# Patient Record
Sex: Male | Born: 1975 | Race: Black or African American | Hispanic: No | Marital: Married | Smoking: Never smoker
Health system: Southern US, Community
[De-identification: ages and names within clinical notes are randomized; demographics above are authoritative.]

---

## 2000-09-29 ENCOUNTER — Emergency Department (HOSPITAL_COMMUNITY): Admission: EM | Admit: 2000-09-29 | Discharge: 2000-09-29 | Payer: Self-pay | Admitting: Emergency Medicine

## 2002-04-08 ENCOUNTER — Emergency Department (HOSPITAL_COMMUNITY): Admission: EM | Admit: 2002-04-08 | Discharge: 2002-04-08 | Payer: Self-pay | Admitting: Emergency Medicine

## 2002-04-08 ENCOUNTER — Encounter: Payer: Self-pay | Admitting: Emergency Medicine

## 2003-01-22 ENCOUNTER — Emergency Department (HOSPITAL_COMMUNITY): Admission: EM | Admit: 2003-01-22 | Discharge: 2003-01-23 | Payer: Self-pay | Admitting: Emergency Medicine

## 2006-04-26 ENCOUNTER — Emergency Department (HOSPITAL_COMMUNITY): Admission: EM | Admit: 2006-04-26 | Discharge: 2006-04-26 | Payer: Self-pay | Admitting: Emergency Medicine

## 2008-07-04 ENCOUNTER — Ambulatory Visit (HOSPITAL_COMMUNITY): Admission: RE | Admit: 2008-07-04 | Discharge: 2008-07-04 | Payer: Self-pay | Admitting: Family Medicine

## 2009-11-22 ENCOUNTER — Emergency Department (HOSPITAL_COMMUNITY): Admission: EM | Admit: 2009-11-22 | Discharge: 2009-11-22 | Payer: Self-pay | Admitting: Emergency Medicine

## 2011-10-20 ENCOUNTER — Emergency Department (INDEPENDENT_AMBULATORY_CARE_PROVIDER_SITE_OTHER)
Admission: EM | Admit: 2011-10-20 | Discharge: 2011-10-20 | Disposition: A | Discharge status: Home / Self Care | Payer: Self-pay | Source: Home / Self Care | Attending: Emergency Medicine | Admitting: Emergency Medicine

## 2011-10-20 ENCOUNTER — Encounter (HOSPITAL_COMMUNITY): Payer: Self-pay | Admitting: *Deleted

## 2011-10-20 DIAGNOSIS — S61209A Unspecified open wound of unspecified finger without damage to nail, initial encounter: Secondary | ICD-10-CM

## 2011-10-20 DIAGNOSIS — S61219A Laceration without foreign body of unspecified finger without damage to nail, initial encounter: Secondary | ICD-10-CM

## 2011-10-20 DIAGNOSIS — Z23 Encounter for immunization: Secondary | ICD-10-CM

## 2011-10-20 MED ORDER — TETANUS-DIPHTH-ACELL PERTUSSIS 5-2.5-18.5 LF-MCG/0.5 IM SUSP
INTRAMUSCULAR | Status: AC
  Filled 2011-10-20: qty 0.5

## 2011-10-20 MED ORDER — TETANUS-DIPHTH-ACELL PERTUSSIS 5-2.5-18.5 LF-MCG/0.5 IM SUSP
0.5000 mL | Freq: Once | INTRAMUSCULAR | Status: AC
  Administered 2011-10-20: 0.5 mL via INTRAMUSCULAR

## 2011-10-20 MED ORDER — IBUPROFEN 600 MG PO TABS: 600.0000 mg | ORAL_TABLET | Freq: Three times a day (TID) | ORAL | Status: AC | PRN

## 2011-10-20 NOTE — ED Notes (Signed)
Flap laceration right little finger occurred yesterday while doing dishes about 1600.  Last tetanus was over 5 years ago

## 2011-10-20 NOTE — ED Provider Notes (Signed)
History     CSN: [id number]  Arrival date & time 10/20/11  5784   First MD Initiated Contact with Patient 10/20/11 [phone]      Chief Complaint  Patient presents with  . Extremity Laceration    (Consider location/radiation/quality/duration/timing/severity/associated sxs/prior treatment) HPI Comments: Patient during work and doing dishes, sustained a cut to his right external aspects fifth finger. He is hurting and try to clean it yesterday. Woke up with some pain was wondering if it needed stitches. Patient denies any numbness tingling and affected finger or hand.  Patient is a 36 y.o. male presenting with skin laceration. The history is provided by the patient.  Laceration  The incident occurred yesterday. The laceration is 1 cm in size. The laceration mechanism was a broken glass. The pain is at a severity of 2/10. The pain is mild. The pain has been constant since onset. He reports no foreign bodies present. His tetanus status is out of date.    History reviewed. No pertinent past medical history.  History reviewed. No pertinent past surgical history.  History reviewed. No pertinent family history.  History  Substance Use Topics  . Smoking status: Former Smoker    Quit date: 10/19/2001  . Smokeless tobacco: Not on file  . Alcohol Use:      occasionally      Review of Systems  Constitutional: Negative for fever and chills.  Musculoskeletal: Negative for joint swelling.  Skin: Positive for wound.    Allergies  Review of patient's allergies indicates no known allergies.  Home Medications   Current Outpatient Rx  Name Route Sig Dispense Refill  . IBUPROFEN 600 MG PO TABS Oral Take 1 tablet (600 mg total) by mouth every 8 (eight) hours as needed for pain. 21 tablet 0    BP 106/56  Pulse 65  Temp(Src) 97.5 F (36.4 C) (Oral)  Resp 16  SpO2 99%  Physical Exam  Nursing note and vitals reviewed. Constitutional: He appears well-developed and well-nourished.    Eyes: Conjunctivae are normal.  Neck: Neck supple.  Musculoskeletal: He exhibits tenderness.       Hands: Neurological: He is alert. No cranial nerve deficit. He exhibits normal muscle tone.  Skin: Skin is warm.    ED Course  Procedures (including critical care time)  Labs Reviewed - No data to display No results found.   1. Laceration of finger       MDM  Patient presents urgent care more than 24 hours after he sustained a right fifth finger flap type laceration which he was doing dishes at his work. Updated tetanus status today. Wound care, uncomplicated laceration with no tendon or vascular involvement. Steri-Stripped. Patient was tested for for range of motion with no deficits or restrictions of movement.        Jimmie Molly, MD 10/20/11 [phone]

## 2011-10-20 NOTE — Discharge Instructions (Signed)
As discussed we have clean your room and close this flap laceration with Steri-Strips read discharge instructions. In the future if you sustained a laceration you should be seen as quickly as possible to reduce chance of infection. Return if any further concerns this point wound does not look infected, if any increased tenderness, redness or any discharge should return for reevaluation.

## 2012-01-25 ENCOUNTER — Emergency Department (HOSPITAL_COMMUNITY)
Admission: EM | Admit: 2012-01-25 | Discharge: 2012-01-25 | Disposition: A | Discharge status: Home / Self Care | Payer: Self-pay | Attending: Emergency Medicine | Admitting: Emergency Medicine

## 2012-01-25 ENCOUNTER — Encounter (HOSPITAL_COMMUNITY): Payer: Self-pay | Admitting: *Deleted

## 2012-01-25 DIAGNOSIS — F17200 Nicotine dependence, unspecified, uncomplicated: Secondary | ICD-10-CM | POA: Insufficient documentation

## 2012-01-25 DIAGNOSIS — K0889 Other specified disorders of teeth and supporting structures: Secondary | ICD-10-CM

## 2012-01-25 DIAGNOSIS — K089 Disorder of teeth and supporting structures, unspecified: Secondary | ICD-10-CM | POA: Insufficient documentation

## 2012-01-25 MED ORDER — HYDROCODONE-ACETAMINOPHEN 5-325 MG PO TABS: 1.0000 | ORAL_TABLET | Freq: Four times a day (QID) | ORAL | Status: AC | PRN

## 2012-01-25 MED ORDER — PENICILLIN V POTASSIUM 500 MG PO TABS: 500.0000 mg | ORAL_TABLET | Freq: Four times a day (QID) | ORAL | Status: AC

## 2012-01-25 NOTE — ED Notes (Signed)
Left upper dental pain since Friday with swelling

## 2012-01-25 NOTE — ED Notes (Signed)
Left upper tooth pain states knows he has an impacted wisdom tooth on that side hurting since last f4riday

## 2012-01-25 NOTE — ED Provider Notes (Signed)
History    This chart was scribed for James Lennert, MD by Sofie Rower. The patient was seen in room TR07C/TR07C and the patient's care was started at 12:03 PM    CSN: [id number]  Arrival date & time 01/25/12  4098   None     Chief Complaint  Patient presents with  . Dental Pain    (Consider location/radiation/quality/duration/timing/severity/associated sxs/prior treatment) Patient is a 36 y.o. male presenting with tooth pain. The history is provided by the patient. No language interpreter was used.  Dental PainThe primary symptoms include mouth pain. Primary symptoms do not include dental injury or fever. The symptoms began 3 to 5 days ago. The symptoms are unchanged. The symptoms occur constantly.  Mouth pain began 3 - 5 days ago. Mouth pain occurs constantly. Affected locations include: teeth.  Additional symptoms include: trouble swallowing and pain with swallowing.    History reviewed. No pertinent past medical history.  History reviewed. No pertinent past surgical history.  History reviewed. No pertinent family history.  History  Substance Use Topics  . Smoking status: Current Some Day Smoker    Last Attempt to Quit: 10/19/2001  . Smokeless tobacco: Not on file  . Alcohol Use: Yes     occasionally      Review of Systems  Constitutional: Negative for fever.  HENT: Positive for trouble swallowing.   All other systems reviewed and are negative.        Allergies  Review of patient's allergies indicates no known allergies.  Home Medications   Current Outpatient Rx  Name Route Sig Dispense Refill  . IBUPROFEN 200 MG PO TABS Oral Take 800 mg by mouth every 6 (six) hours as needed. For pain      BP 122/73  Pulse 64  Temp 98.4 F (36.9 C) (Oral)  Resp 16  SpO2 96%  Physical Exam  Nursing note and vitals reviewed. Constitutional: He is oriented to person, place, and time. He appears well-developed.  HENT:  Head: Normocephalic.       Left upper  molar tender, swollen.   Eyes: Conjunctivae are normal.  Neck: No tracheal deviation present.  Cardiovascular:  No murmur heard. Musculoskeletal: Normal range of motion.  Neurological: He is oriented to person, place, and time.  Skin: Skin is warm.  Psychiatric: He has a normal mood and affect.    ED Course  Procedures (including critical care time)  DIAGNOSTIC STUDIES: Oxygen Saturation is 96% on room air, adequate by my interpretation.    COORDINATION OF CARE:    10:05PM- EDP at bedside discusses treatment plan concerning pain management and application of antibiotics.    Labs Reviewed - No data to display No results found.   No diagnosis found.    MDM        The chart was scribed for me under my direct supervision.  I personally performed the history, physical, and medical decision making and all procedures in the evaluation of this patient.James Lennert, MD 01/27/12 [phone]

## 2013-08-30 ENCOUNTER — Encounter: Payer: Self-pay | Admitting: Physician Assistant

## 2013-08-30 ENCOUNTER — Ambulatory Visit (INDEPENDENT_AMBULATORY_CARE_PROVIDER_SITE_OTHER): Payer: BC Managed Care – PPO | Admitting: Physician Assistant

## 2013-08-30 VITALS — BP 120/70 | HR 60 | Temp 98.0°F | Resp 16 | Ht 69.5 in | Wt 191.0 lb

## 2013-08-30 DIAGNOSIS — J329 Chronic sinusitis, unspecified: Secondary | ICD-10-CM

## 2013-08-30 DIAGNOSIS — R0981 Nasal congestion: Secondary | ICD-10-CM

## 2013-08-30 DIAGNOSIS — J3489 Other specified disorders of nose and nasal sinuses: Secondary | ICD-10-CM

## 2013-08-30 MED ORDER — AMOXICILLIN-POT CLAVULANATE 875-125 MG PO TABS: 1.0000 | ORAL_TABLET | Freq: Two times a day (BID) | ORAL | Status: DC

## 2013-08-30 NOTE — Progress Notes (Signed)
   Subjective:    Patient ID: James Blair, male    DOB: 1976/04/29, 38 y.o.   MRN: [id number]  HPI 38 year old male presents for evaluation of 1 week history of nasal congestion, sinus pressure/pain, PND, and slight cough.  Symptoms have been progressively worsening despite treatment with OTC Mucinex daily.  He has also been using a humidifier which normally helps but has not this time.  No significant hx of frequent sinus infections or seasonal allergies.   Denies fever, chills, nausea, vomiting, otalgia, sore throat, SOB, wheezing, chest pain, or dizziness. Does have slight sinus headache due to pressure.  Patient is otherwise healthy with no other concerns today.    Congestion x 1 week Mucinex and humidifier  Subjective chills no fever  Review of Systems  Constitutional: Negative for fever and chills.  HENT: Positive for congestion, postnasal drip, rhinorrhea and sinus pressure. Negative for ear pain and sore throat.   Respiratory: Positive for cough. Negative for chest tightness, shortness of breath and wheezing.   Cardiovascular: Negative for chest pain.  Gastrointestinal: Negative for nausea, vomiting and abdominal pain.  Neurological: Positive for headaches. Negative for dizziness.       Objective:   Physical Exam  Constitutional: He is oriented to person, place, and time. He appears well-developed and well-nourished.  HENT:  Head: Normocephalic and atraumatic.  Right Ear: Hearing, tympanic membrane, external ear and ear canal normal.  Left Ear: Hearing, tympanic membrane, external ear and ear canal normal.  Mouth/Throat: Uvula is midline, oropharynx is clear and moist and mucous membranes are normal. No oropharyngeal exudate.  Eyes: Conjunctivae are normal.  Neck: Normal range of motion. Neck supple.  Cardiovascular: Normal rate, regular rhythm and normal heart sounds.   Pulmonary/Chest: Effort normal and breath sounds normal.  Lymphadenopathy:    He has no cervical  adenopathy.  Neurological: He is alert and oriented to person, place, and time.  Psychiatric: He has a normal mood and affect. His behavior is normal. Judgment and thought content normal.          Assessment & Plan:  Sinus infection - Plan: amoxicillin-clavulanate (AUGMENTIN) 875-125 MG per tablet  Will treat with Augmentin 875 mg bid x 10 days Continue Mucinex as directed Increase fluids and rest Follow up if symptoms worsen or fail to improve.

## 2013-11-23 ENCOUNTER — Ambulatory Visit (INDEPENDENT_AMBULATORY_CARE_PROVIDER_SITE_OTHER): Payer: BC Managed Care – PPO | Admitting: Family Medicine

## 2013-11-23 VITALS — BP 120/78 | HR 61 | Temp 98.3°F | Resp 20 | Ht 69.5 in | Wt 187.8 lb

## 2013-11-23 DIAGNOSIS — J329 Chronic sinusitis, unspecified: Secondary | ICD-10-CM

## 2013-11-23 DIAGNOSIS — J209 Acute bronchitis, unspecified: Secondary | ICD-10-CM

## 2013-11-23 MED ORDER — HYDROCODONE-HOMATROPINE 5-1.5 MG/5ML PO SYRP: 5.0000 mL | ORAL_SOLUTION | Freq: Three times a day (TID) | ORAL | Status: DC | PRN

## 2013-11-23 MED ORDER — AMOXICILLIN-POT CLAVULANATE 875-125 MG PO TABS: 1.0000 | ORAL_TABLET | Freq: Two times a day (BID) | ORAL | Status: DC

## 2013-11-23 NOTE — Progress Notes (Signed)
Patient ID: James Blair MRN: [id number], DOB: 07/30/1975, 38 y.o. Date of Encounter: 11/23/2013, 7:52 PM  Primary Physician: No PCP Per Patient  Chief Complaint:  Chief Complaint  Patient presents with  . URI    cough with yellow/green sputum that started today.  nasal/head congestion.      HPI: 38 y.o. year old male presents with a 2 day history of nasal congestion, post nasal drip, sore throat, and cough. Mild sinus pressure. Afebrile. No chills. Nasal congestion thick and green/yellow. Cough is productive of green/yellow sputum and not associated with time of day. Ears feel full, leading to sensation of muffled hearing. Has tried OTC cold preps without success. No GI complaints.   No sick contacts, recent antibiotics, or recent travels.   No leg trauma, sedentary periods, h/o cancer, or tobacco use.  No past medical history on file.   Home Meds: Prior to Admission medications   Medication Sig Start Date End Date Taking? Authorizing Provider  ibuprofen (ADVIL,MOTRIN) 200 MG tablet Take 800 mg by mouth every 6 (six) hours as needed. For pain   Yes Historical Provider, MD    Allergies: No Known Allergies  History   Social History  . Marital Status: Married    Spouse Name: N/A    Number of Children: N/A  . Years of Education: N/A   Occupational History  . Not on file.   Social History Main Topics  . Smoking status: Current Some Day Smoker    Last Attempt to Quit: 10/19/2001  . Smokeless tobacco: Not on file  . Alcohol Use: Yes     Comment: occasionally  . Drug Use: Yes     Comment: marijuana  . Sexual Activity: Not on file   Other Topics Concern  . Not on file   Social History Narrative  . No narrative on file     Review of Systems: Constitutional: negative for chills, fever, night sweats or weight changes Cardiovascular: negative for chest pain or palpitations Respiratory: negative for hemoptysis, wheezing, or shortness of breath Abdominal: negative  for abdominal pain, nausea, vomiting or diarrhea Dermatological: negative for rash Neurologic: negative for headache   Physical Exam: Blood pressure 120/78, pulse 61, temperature 98.3 F (36.8 C), temperature source Oral, resp. rate 20, height 5' 9.5" (1.765 m), weight 187 lb 12.8 oz (85.186 kg), SpO2 99.00%., Body mass index is 27.35 kg/(m^2). General: Well developed, well nourished, in no acute distress. Head: Normocephalic, atraumatic, eyes without discharge, sclera non-icteric, nares are congested. Bilateral auditory canals clear, TM's are without perforation, pearly grey with reflective cone of light bilaterally. No sinus TTP. Oral cavity moist, dentition normal. Posterior pharynx with post nasal drip and mild erythema. No peritonsillar abscess or tonsillar exudate. Neck: Supple. No thyromegaly. Full ROM. No lymphadenopathy. Lungs: Coarse breath sounds bilaterally without wheezes, rales, or rhonchi. Breathing is unlabored.  Heart: RRR with S1 S2. No murmurs, rubs, or gallops appreciated. Msk:  Strength and tone normal for age. Extremities: No clubbing or cyanosis. No edema. Neuro: Alert and oriented X 3. Moves all extremities spontaneously. CNII-XII grossly in tact. Psych:  Responds to questions appropriately with a normal affect.   Labs:   ASSESSMENT AND PLAN:  38 y.o. year old male with bronchitis. Sinus infection - Plan: amoxicillin-clavulanate (AUGMENTIN) 875-125 MG per tablet  Acute bronchitis - Plan: amoxicillin-clavulanate (AUGMENTIN) 875-125 MG per tablet, HYDROcodone-homatropine (HYCODAN) 5-1.5 MG/5ML syrup   - -Mucinex -Tylenol/Motrin prn -Rest/fluids -RTC precautions -RTC 3-5 days if no improvement  Signed,  Elvina SidleKurt Catharine Kettlewell, MD 11/23/2013 7:52 PM

## 2014-03-09 ENCOUNTER — Encounter (HOSPITAL_COMMUNITY): Payer: Self-pay | Admitting: Emergency Medicine

## 2014-03-09 ENCOUNTER — Emergency Department (HOSPITAL_COMMUNITY)
Admission: EM | Admit: 2014-03-09 | Discharge: 2014-03-09 | Disposition: A | Discharge status: Other Acute Inpatient Hospital | Payer: BC Managed Care – PPO | Source: Home / Self Care | Attending: Family Medicine | Admitting: Family Medicine

## 2014-03-09 ENCOUNTER — Emergency Department (HOSPITAL_COMMUNITY)
Admission: EM | Admit: 2014-03-09 | Discharge: 2014-03-09 | Disposition: A | Discharge status: Home / Self Care | Payer: BC Managed Care – PPO | Attending: Emergency Medicine | Admitting: Emergency Medicine

## 2014-03-09 DIAGNOSIS — Z79899 Other long term (current) drug therapy: Secondary | ICD-10-CM | POA: Insufficient documentation

## 2014-03-09 DIAGNOSIS — F17200 Nicotine dependence, unspecified, uncomplicated: Secondary | ICD-10-CM | POA: Diagnosis not present

## 2014-03-09 DIAGNOSIS — R1084 Generalized abdominal pain: Secondary | ICD-10-CM

## 2014-03-09 DIAGNOSIS — R197 Diarrhea, unspecified: Secondary | ICD-10-CM | POA: Diagnosis not present

## 2014-03-09 DIAGNOSIS — A084 Viral intestinal infection, unspecified: Secondary | ICD-10-CM

## 2014-03-09 DIAGNOSIS — A088 Other specified intestinal infections: Secondary | ICD-10-CM

## 2014-03-09 DIAGNOSIS — Z791 Long term (current) use of non-steroidal anti-inflammatories (NSAID): Secondary | ICD-10-CM | POA: Insufficient documentation

## 2014-03-09 DIAGNOSIS — R109 Unspecified abdominal pain: Secondary | ICD-10-CM | POA: Diagnosis present

## 2014-03-09 DIAGNOSIS — R112 Nausea with vomiting, unspecified: Secondary | ICD-10-CM | POA: Insufficient documentation

## 2014-03-09 LAB — BASIC METABOLIC PANEL
ANION GAP: 13 (ref 5–15)
BUN: 8 mg/dL (ref 6–23)
CALCIUM: 8.9 mg/dL (ref 8.4–10.5)
CO2: 28 mEq/L (ref 19–32)
Chloride: 95 mEq/L — ABNORMAL LOW (ref 96–112)
Creatinine, Ser: 1.05 mg/dL (ref 0.50–1.35)
GFR calc Af Amer: >90 mL/min (ref >90)
GFR, EST NON AFRICAN AMERICAN: 88 mL/min — AB (ref >90)
Glucose, Bld: 117 mg/dL — ABNORMAL HIGH (ref 70–99)
Potassium: 3.6 mEq/L — ABNORMAL LOW (ref 3.7–5.3)
SODIUM: 136 meq/L — AB (ref 137–147)

## 2014-03-09 LAB — POCT I-STAT, CHEM 8
BUN: 6 mg/dL (ref 6–23)
CALCIUM ION: 1.11 mmol/L — AB (ref 1.12–1.23)
Chloride: 97 mEq/L (ref 96–112)
Creatinine, Ser: 1.1 mg/dL (ref 0.50–1.35)
GLUCOSE: 134 mg/dL — AB (ref 70–99)
HEMATOCRIT: 46 % (ref 39.0–52.0)
HEMOGLOBIN: 15.6 g/dL (ref 13.0–17.0)
POTASSIUM: 3.3 meq/L — AB (ref 3.7–5.3)
Sodium: 131 mEq/L — ABNORMAL LOW (ref 137–147)
TCO2: 29 mmol/L (ref 0–100)

## 2014-03-09 LAB — CBC WITH DIFFERENTIAL/PLATELET
BASOS ABS: 0 10*3/uL (ref 0.0–0.1)
Basophils Relative: 0 % (ref 0–1)
EOS ABS: 0 10*3/uL (ref 0.0–0.7)
Eosinophils Relative: 0 % (ref 0–5)
HCT: 38.5 % — ABNORMAL LOW (ref 39.0–52.0)
Hemoglobin: 13.3 g/dL (ref 13.0–17.0)
LYMPHS PCT: 19 % (ref 12–46)
Lymphs Abs: 1.2 10*3/uL (ref 0.7–4.0)
MCH: 28.5 pg (ref 26.0–34.0)
MCHC: 34.5 g/dL (ref 30.0–36.0)
MCV: 82.6 fL (ref 78.0–100.0)
Monocytes Absolute: 1.6 10*3/uL — ABNORMAL HIGH (ref 0.1–1.0)
Monocytes Relative: 25 % — ABNORMAL HIGH (ref 3–12)
NEUTROS PCT: 56 % (ref 43–77)
Neutro Abs: 3.6 10*3/uL (ref 1.7–7.7)
Platelets: 196 10*3/uL (ref 150–400)
RBC: 4.66 MIL/uL (ref 4.22–5.81)
RDW: 12.2 % (ref 11.5–15.5)
WBC: 6.4 10*3/uL (ref 4.0–10.5)

## 2014-03-09 LAB — URINALYSIS, ROUTINE W REFLEX MICROSCOPIC
BILIRUBIN URINE: NEGATIVE
Glucose, UA: NEGATIVE mg/dL
Ketones, ur: 15 mg/dL — AB
Leukocytes, UA: NEGATIVE
NITRITE: NEGATIVE
Protein, ur: 30 mg/dL — AB
SPECIFIC GRAVITY, URINE: 1.013 (ref 1.005–1.030)
UROBILINOGEN UA: 0.2 mg/dL (ref 0.0–1.0)
pH: 6 (ref 5.0–8.0)

## 2014-03-09 LAB — HEPATIC FUNCTION PANEL
ALK PHOS: 56 U/L (ref 39–117)
ALT: 20 U/L (ref 0–53)
AST: 18 U/L (ref 0–37)
Albumin: 3.2 g/dL — ABNORMAL LOW (ref 3.5–5.2)
BILIRUBIN INDIRECT: 0.9 mg/dL (ref 0.3–0.9)
Bilirubin, Direct: 0.2 mg/dL (ref 0.0–0.3)
TOTAL PROTEIN: 6.6 g/dL (ref 6.0–8.3)
Total Bilirubin: 1.1 mg/dL (ref 0.3–1.2)

## 2014-03-09 LAB — LIPASE, BLOOD: LIPASE: 12 U/L (ref 11–59)

## 2014-03-09 LAB — URINE MICROSCOPIC-ADD ON: RBC / HPF: NONE SEEN RBC/hpf (ref <3)

## 2014-03-09 MED ORDER — SODIUM CHLORIDE 0.9 % IV BOLUS (SEPSIS)
1000.0000 mL | Freq: Once | INTRAVENOUS | Status: AC
  Administered 2014-03-09: 1000 mL via INTRAVENOUS

## 2014-03-09 MED ORDER — DICYCLOMINE HCL 20 MG PO TABS: 20.0000 mg | ORAL_TABLET | Freq: Two times a day (BID) | ORAL | Status: DC

## 2014-03-09 MED ORDER — HYDROCODONE-ACETAMINOPHEN 5-325 MG PO TABS: ORAL_TABLET | ORAL | Status: DC

## 2014-03-09 MED ORDER — DICYCLOMINE HCL 10 MG PO CAPS
10.0000 mg | ORAL_CAPSULE | Freq: Once | ORAL | Status: AC
  Administered 2014-03-09: 10 mg via ORAL
  Filled 2014-03-09: qty 1

## 2014-03-09 MED ORDER — ONDANSETRON HCL 4 MG/2ML IJ SOLN
4.0000 mg | Freq: Once | INTRAMUSCULAR | Status: AC
  Administered 2014-03-09: 4 mg via INTRAVENOUS
  Filled 2014-03-09: qty 2

## 2014-03-09 MED ORDER — MORPHINE SULFATE 4 MG/ML IJ SOLN
4.0000 mg | INTRAMUSCULAR | Status: DC | PRN
  Filled 2014-03-09: qty 1

## 2014-03-09 MED ORDER — PROMETHAZINE HCL 25 MG PO TABS: 25.0000 mg | ORAL_TABLET | Freq: Four times a day (QID) | ORAL | Status: DC | PRN

## 2014-03-09 NOTE — ED Provider Notes (Signed)
Medical screening examination/treatment/procedure(s) were performed by non-physician practitioner and as supervising physician I was immediately available for consultation/collaboration.   EKG Interpretation None      Devoria Albe, MD, Armando Gang   Ward Givens, MD 03/09/14 1251

## 2014-03-09 NOTE — ED Notes (Signed)
Nausea and vomiting since Thurs lasted one day; no anorexic with diarr. Pt sent from UC due to abnormal lab work.

## 2014-03-09 NOTE — Discharge Instructions (Signed)
Take vicodin for breakthrough pain, do not drink alcohol, drive, care for children or do other critical tasks while taking vicodin.  Push fluids: take small frequent sips of water or Gatorade, do not drink any soda, juice or caffeinated beverages.    Slowly resume solid diet as desired. Avoid food that are spicy, contain dairy and/or have high fat content.  Aviod NSAIDs (aspirin, motrin, ibuprofen, naproxen, Aleve et Karie Soda) for pain control because they will irritate your stomach.  Please establish primary care using resource guide below. You may also want to call [facility] or [facility] offices. They must obtain records for further management.   Do not hesitate to return to the Emergency Department for any new, worsening or concerning symptoms.

## 2014-03-09 NOTE — ED Provider Notes (Signed)
CSN: [id number]     Arrival date & time 03/09/14  0930 History   First MD Initiated Contact with Patient 03/09/14 [phone]     Chief Complaint  Patient presents with  . Diarrhea  . Abdominal Pain     (Consider location/radiation/quality/duration/timing/severity/associated sxs/prior Treatment) HPI  James Blair is a 38 y.o. male who is otherwise healthy complaining of low back pain with objective fever and chills onset 4 days ago followed by nonbloody, nonbilious, no coffee ground emesis and watery diarrhea approximately 10 times per day with midline lower abdominal pain and rectal discomfort, states stool is becoming more formed; he has noticed blood streaking in the stool today. Pain is 7/10 and exacerbated with bowel movements. He denies active fever and chills, recent antibiotic use, sick contacts, chest pain, shortness of breath. States that he was seen at urgent care several days ago and given Zofran and loperamide and started a Bratt diet: the vomiting has resolved but the diarrhea is still active. Denies any pain at the moment. Denies dysuria, hematuria, urethral discharge, testicular pain swelling, rash, concern about STDs.  History reviewed. No pertinent past medical history. History reviewed. No pertinent past surgical history. History reviewed. No pertinent family history. History  Substance Use Topics  . Smoking status: Current Some Day Smoker    Last Attempt to Quit: 10/19/2001  . Smokeless tobacco: Not on file  . Alcohol Use: Yes     Comment: occasionally    Review of Systems  10 systems reviewed and found to be negative, except as noted in the HPI.    Allergies  Review of patient's allergies indicates no known allergies.  Home Medications   Prior to Admission medications   Medication Sig Start Date End Date Taking? Authorizing Provider  diphenoxylate-atropine (LOMOTIL) 2.5-0.025 MG per tablet Take 1 tablet by mouth 4 (four) times daily as needed for diarrhea or  loose stools.   Yes Historical Provider, MD  ibuprofen (ADVIL,MOTRIN) 200 MG tablet Take 800 mg by mouth every 6 (six) hours as needed for fever, headache or mild pain. For pain   Yes Historical Provider, MD  meloxicam (MOBIC) 15 MG tablet Take 15 mg by mouth daily. 02/26/14  Yes Historical Provider, MD  ondansetron (ZOFRAN-ODT) 8 MG disintegrating tablet Take 8 mg by mouth every 8 (eight) hours as needed for nausea or vomiting.   Yes Historical Provider, MD  dicyclomine (BENTYL) 20 MG tablet Take 1 tablet (20 mg total) by mouth 2 (two) times daily. 03/09/14   Jennessa Trigo, PA-C  HYDROcodone-acetaminophen (NORCO/VICODIN) 5-325 MG per tablet Take 1-2 tablets by mouth every 6 hours as needed for pain. 03/09/14   Tauheed Mcfayden, PA-C  promethazine (PHENERGAN) 25 MG tablet Take 1 tablet (25 mg total) by mouth every 6 (six) hours as needed for nausea or vomiting. 03/09/14   Joni Reining Kennette Cuthrell, PA-C   BP 142/79  Pulse 57  Temp(Src) 99 F (37.2 C) (Oral)  Resp 12  Ht  (1.753 m)  Wt 189 lb (85.73 kg)  BMI 27.90 kg/m2  SpO2 97% Physical Exam  Nursing note and vitals reviewed. Constitutional: He is oriented to person, place, and time. He appears well-developed and well-nourished. No distress.  HENT:  Head: Normocephalic and atraumatic.  Mouth/Throat: Oropharynx is clear and moist.  Eyes: Conjunctivae and EOM are normal.  Neck: Normal range of motion.  Cardiovascular: Normal rate, regular rhythm and intact distal pulses.   Pulmonary/Chest: Effort normal and breath sounds normal. No stridor.  Abdominal: Soft. Bowel  sounds are normal. He exhibits no distension and no mass. There is no tenderness. There is no rebound and no guarding.  Very mild diffuse bilateral lower quadrant tenderness to palpation with no guarding or rebound.  Genitourinary:  No CVA tenderness to palpation bilaterally  Musculoskeletal: Normal range of motion.  Neurological: He is alert and oriented to person, place, and  time.  Psychiatric: He has a normal mood and affect.    ED Course  Procedures (including critical care time) Labs Review Labs Reviewed  CBC WITH DIFFERENTIAL - Abnormal; Notable for the following:    HCT 38.5 (*)    Monocytes Relative 25 (*)    Monocytes Absolute 1.6 (*)    All other components within normal limits  BASIC METABOLIC PANEL - Abnormal; Notable for the following:    Sodium 136 (*)    Potassium 3.6 (*)    Chloride 95 (*)    Glucose, Bld 117 (*)    GFR calc non Af Amer 88 (*)    All other components within normal limits  HEPATIC FUNCTION PANEL - Abnormal; Notable for the following:    Albumin 3.2 (*)    All other components within normal limits  URINALYSIS, ROUTINE W REFLEX MICROSCOPIC - Abnormal; Notable for the following:    Hgb urine dipstick TRACE (*)    Ketones, ur 15 (*)    Protein, ur 30 (*)    All other components within normal limits  URINE MICROSCOPIC-ADD ON - Abnormal; Notable for the following:    Bacteria, UA FEW (*)    All other components within normal limits  LIPASE, BLOOD    Imaging Review No results found.   EKG Interpretation None      MDM   Final diagnoses:  Nausea vomiting and diarrhea  Generalized abdominal pain   Filed Vitals:   03/09/14 0948 03/09/14 1030 03/09/14 1100 03/09/14 1143  BP: 135/82 146/73 144/71 142/79  Pulse: 61 55 107 57  Temp: 98.4 F (36.9 C)   99 F (37.2 C)  TempSrc: Oral   Oral  Resp: Height:  (1.753 m)     Weight: 189 lb (85.73 kg)     SpO2: 100% 97% 100% 97%    Medications  morphine 4 MG/ML injection 4 mg (not administered)  sodium chloride 0.9 % bolus 1,000 mL (1,000 mLs Intravenous New Bag/Given 03/09/14 1046)  ondansetron (ZOFRAN) injection 4 mg (4 mg Intravenous Given 03/09/14 1035)  dicyclomine (BENTYL) capsule 10 mg (10 mg Oral Given 03/09/14 1035)  sodium chloride 0.9 % bolus 1,000 mL (0 mLs Intravenous Stopped 03/09/14 1046)    James Blair is a 38 y.o. male presenting  with Patient is nontoxic, nonseptic appearing, in no apparent distress.  Patient's pain and other symptoms adequately managed in emergency department.  Fluid bolus given.  Labs, imaging and vitals reviewed.  Patient does not meet the SIRS or Sepsis criteria.  On repeat exam patient does not have a surgical abdomin and there are no peritoneal signs.  No indication of appendicitis, bowel obstruction, bowel perforation, cholecystitis, diverticulitis. Patient discharged home with symptomatic treatment and given strict instructions for follow-up with their primary care physician.  I have also discussed reasons to return immediately to the ER.  Patient expresses understanding and agrees with plan.  Evaluation does not show pathology that would require ongoing emergent intervention or inpatient treatment. Pt is hemodynamically stable and mentating appropriately. Discussed findings and plan with patient/guardian, who agrees with care  plan. All questions answered. Return precautions discussed and outpatient follow up given.   New Prescriptions   DICYCLOMINE (BENTYL) 20 MG TABLET    Take 1 tablet (20 mg total) by mouth 2 (two) times daily.   HYDROCODONE-ACETAMINOPHEN (NORCO/VICODIN) 5-325 MG PER TABLET    Take 1-2 tablets by mouth every 6 hours as needed for pain.   PROMETHAZINE (PHENERGAN) 25 MG TABLET    Take 1 tablet (25 mg total) by mouth every 6 (six) hours as needed for nausea or vomiting.        Wynetta Emery, PA-C 03/09/14 1248  Nicandro Perrault, PA-C 03/09/14 1251

## 2014-03-09 NOTE — ED Notes (Signed)
Pt provided ginger ale. Tolerating well.

## 2014-03-09 NOTE — ED Notes (Signed)
C/o 1 week duration of abdominal pain, nausea, vomiting. See last week for stomach problems at another urgent care, but continues to  Have problem w lower abdominal pain, diarrhea and vomiting. No one else in home ill

## 2014-03-09 NOTE — ED Provider Notes (Signed)
CSN: [id number]     Arrival date & time 03/09/14  0930 History   First MD Initiated Contact with Patient 03/09/14 [phone]     Chief Complaint  Patient presents with  . Diarrhea  . Abdominal Pain   (Consider location/radiation/quality/duration/timing/severity/associated sxs/prior Treatment) HPI  History reviewed. No pertinent past medical history. History reviewed. No pertinent past surgical history. History reviewed. No pertinent family history. History  Substance Use Topics  . Smoking status: Current Some Day Smoker    Last Attempt to Quit: 10/19/2001  . Smokeless tobacco: Not on file  . Alcohol Use: Yes     Comment: occasionally    Review of Systems  Allergies  Review of patient's allergies indicates no known allergies.  Home Medications   Prior to Admission medications   Medication Sig Start Date End Date Taking? Authorizing Provider  diphenoxylate-atropine (LOMOTIL) 2.5-0.025 MG per tablet Take 1 tablet by mouth 4 (four) times daily as needed for diarrhea or loose stools.   Yes Historical Provider, MD  ibuprofen (ADVIL,MOTRIN) 200 MG tablet Take 800 mg by mouth every 6 (six) hours as needed for fever, headache or mild pain. For pain   Yes Historical Provider, MD  meloxicam (MOBIC) 15 MG tablet Take 15 mg by mouth daily. 02/26/14  Yes Historical Provider, MD  ondansetron (ZOFRAN-ODT) 8 MG disintegrating tablet Take 8 mg by mouth every 8 (eight) hours as needed for nausea or vomiting.   Yes Historical Provider, MD  dicyclomine (BENTYL) 20 MG tablet Take 1 tablet (20 mg total) by mouth 2 (two) times daily. 03/09/14   Nicole Pisciotta, PA-C  HYDROcodone-acetaminophen (NORCO/VICODIN) 5-325 MG per tablet Take 1-2 tablets by mouth every 6 hours as needed for pain. 03/09/14   Nicole Pisciotta, PA-C  promethazine (PHENERGAN) 25 MG tablet Take 1 tablet (25 mg total) by mouth every 6 (six) hours as needed for nausea or vomiting. 03/09/14   Joni Reining Pisciotta, PA-C   BP 136/69  Pulse 57   Temp(Src) 99 F (37.2 C) (Oral)  Resp 26  Ht  (1.753 m)  Wt 189 lb (85.73 kg)  BMI 27.90 kg/m2  SpO2 98% Physical Exam  ED Course  Procedures (including critical care time) Labs Review Labs Reviewed  CBC WITH DIFFERENTIAL - Abnormal; Notable for the following:    HCT 38.5 (*)    Monocytes Relative 25 (*)    Monocytes Absolute 1.6 (*)    All other components within normal limits  BASIC METABOLIC PANEL - Abnormal; Notable for the following:    Sodium 136 (*)    Potassium 3.6 (*)    Chloride 95 (*)    Glucose, Bld 117 (*)    GFR calc non Af Amer 88 (*)    All other components within normal limits  HEPATIC FUNCTION PANEL - Abnormal; Notable for the following:    Albumin 3.2 (*)    All other components within normal limits  URINALYSIS, ROUTINE W REFLEX MICROSCOPIC - Abnormal; Notable for the following:    Hgb urine dipstick TRACE (*)    Ketones, ur 15 (*)    Protein, ur 30 (*)    All other components within normal limits  URINE MICROSCOPIC-ADD ON - Abnormal; Notable for the following:    Bacteria, UA FEW (*)    All other components within normal limits  LIPASE, BLOOD    Imaging Review No results found.   MDM   1. Nausea vomiting and diarrhea   2. Generalized abdominal pain  Linna Hoff, MD 03/09/14 1332

## 2014-03-09 NOTE — ED Provider Notes (Signed)
CSN: [id number]     Arrival date & time 03/09/14  4098 History   First MD Initiated Contact with Patient 03/09/14 0831     Chief Complaint  Patient presents with  . Abdominal Pain   (Consider location/radiation/quality/duration/timing/severity/associated sxs/prior Treatment) Patient is a 38 y.o. male presenting with abdominal pain. The history is provided by the patient and the spouse.  Abdominal Pain Pain location:  LLQ and RLQ Pain quality: cramping   Pain radiates to:  Does not radiate Pain severity:  Mild Onset quality:  Gradual Duration:  4 days Progression:  Worsening Chronicity:  New Context: not suspicious food intake   Context comment:  Seen at [facility] and given meds, sx continue.blood now with bm. Associated symptoms: anorexia, diarrhea, nausea and vomiting   Associated symptoms: no fever and no melena     History reviewed. No pertinent past medical history. History reviewed. No pertinent past surgical history. History reviewed. No pertinent family history. History  Substance Use Topics  . Smoking status: Current Some Day Smoker    Last Attempt to Quit: 10/19/2001  . Smokeless tobacco: Not on file  . Alcohol Use: Yes     Comment: occasionally    Review of Systems  Constitutional: Positive for appetite change. Negative for fever and activity change.  HENT: Negative.   Gastrointestinal: Positive for nausea, vomiting, abdominal pain, diarrhea, blood in stool and anorexia. Negative for melena.  Genitourinary: Positive for difficulty urinating.  Musculoskeletal: Negative.   Skin: Negative.     Allergies  Review of patient's allergies indicates no known allergies.  Home Medications   Prior to Admission medications   Medication Sig Start Date End Date Taking? Authorizing Provider  diphenoxylate-atropine (LOMOTIL) 2.5-0.025 MG per tablet Take 1 tablet by mouth 4 (four) times daily as needed for diarrhea or loose stools.   Yes Historical Provider, MD   ondansetron (ZOFRAN-ODT) 8 MG disintegrating tablet Take 8 mg by mouth every 8 (eight) hours as needed for nausea or vomiting.   Yes Historical Provider, MD  amoxicillin-clavulanate (AUGMENTIN) 875-125 MG per tablet Take 1 tablet by mouth 2 (two) times daily. 11/23/13   Elvina Sidle, MD  HYDROcodone-homatropine [facility]) 5-1.5 MG/5ML syrup Take 5 mLs by mouth every 8 (eight) hours as needed for cough. 11/23/13   Elvina Sidle, MD  ibuprofen (ADVIL,MOTRIN) 200 MG tablet Take 800 mg by mouth every 6 (six) hours as needed. For pain    Historical Provider, MD   Temp(Src) 99.3 F (37.4 C) (Oral)  Resp 12  SpO2 100% Physical Exam  Nursing note and vitals reviewed. Constitutional: He appears well-developed and well-nourished.  HENT:  Mouth/Throat: Oropharynx is clear and moist.  Pulmonary/Chest: Effort normal and breath sounds normal.  Abdominal: Soft. Bowel sounds are normal. He exhibits no distension and no mass. There is no hepatosplenomegaly. There is tenderness in the suprapubic area. There is no rigidity, no rebound, no guarding, no CVA tenderness, no tenderness at McBurney's point and negative Murphy's sign.  Skin: Skin is warm and dry.    ED Course  Procedures (including critical care time) Labs Review Labs Reviewed  POCT I-STAT, CHEM 8 - Abnormal; Notable for the following:    Sodium 131 (*)    Potassium 3.3 (*)    Glucose, Bld 134 (*)    Calcium, Ion 1.11 (*)    All other components within normal limits    Imaging Review No results found.   MDM   1. Gastroenteritis and colitis, viral  Sent for protracted n/v/d in spite of meds and bland diet changes.    Linna Hoff, MD 03/09/14 [phone]

## 2014-03-11 ENCOUNTER — Emergency Department (HOSPITAL_COMMUNITY): Payer: BC Managed Care – PPO

## 2014-03-11 ENCOUNTER — Encounter (HOSPITAL_COMMUNITY): Payer: Self-pay | Admitting: Emergency Medicine

## 2014-03-11 ENCOUNTER — Emergency Department (HOSPITAL_COMMUNITY)
Admission: EM | Admit: 2014-03-11 | Discharge: 2014-03-11 | Disposition: A | Discharge status: Home / Self Care | Payer: BC Managed Care – PPO | Attending: Emergency Medicine | Admitting: Emergency Medicine

## 2014-03-11 DIAGNOSIS — E876 Hypokalemia: Secondary | ICD-10-CM | POA: Diagnosis not present

## 2014-03-11 DIAGNOSIS — R109 Unspecified abdominal pain: Secondary | ICD-10-CM | POA: Insufficient documentation

## 2014-03-11 DIAGNOSIS — R197 Diarrhea, unspecified: Secondary | ICD-10-CM | POA: Diagnosis not present

## 2014-03-11 DIAGNOSIS — R748 Abnormal levels of other serum enzymes: Secondary | ICD-10-CM

## 2014-03-11 DIAGNOSIS — F17200 Nicotine dependence, unspecified, uncomplicated: Secondary | ICD-10-CM | POA: Diagnosis not present

## 2014-03-11 DIAGNOSIS — Z791 Long term (current) use of non-steroidal anti-inflammatories (NSAID): Secondary | ICD-10-CM | POA: Insufficient documentation

## 2014-03-11 DIAGNOSIS — Z79899 Other long term (current) drug therapy: Secondary | ICD-10-CM | POA: Insufficient documentation

## 2014-03-11 LAB — COMPREHENSIVE METABOLIC PANEL
ALT: 17 U/L (ref 0–53)
ANION GAP: 14 (ref 5–15)
AST: 24 U/L (ref 0–37)
Albumin: 3.3 g/dL — ABNORMAL LOW (ref 3.5–5.2)
Alkaline Phosphatase: 59 U/L (ref 39–117)
BUN: 6 mg/dL (ref 6–23)
CALCIUM: 9.3 mg/dL (ref 8.4–10.5)
CO2: 27 mEq/L (ref 19–32)
CREATININE: 0.91 mg/dL (ref 0.50–1.35)
Chloride: 96 mEq/L (ref 96–112)
GFR calc non Af Amer: >90 mL/min (ref >90)
GLUCOSE: 104 mg/dL — AB (ref 70–99)
Potassium: 3.1 mEq/L — ABNORMAL LOW (ref 3.7–5.3)
Sodium: 137 mEq/L (ref 137–147)
TOTAL PROTEIN: 6.7 g/dL (ref 6.0–8.3)
Total Bilirubin: 0.9 mg/dL (ref 0.3–1.2)

## 2014-03-11 LAB — CBC WITH DIFFERENTIAL/PLATELET
Basophils Absolute: 0 10*3/uL (ref 0.0–0.1)
Basophils Relative: 1 % (ref 0–1)
EOS ABS: 0 10*3/uL (ref 0.0–0.7)
EOS PCT: 0 % (ref 0–5)
HEMATOCRIT: 37.7 % — AB (ref 39.0–52.0)
Hemoglobin: 13.1 g/dL (ref 13.0–17.0)
LYMPHS ABS: 1.8 10*3/uL (ref 0.7–4.0)
Lymphocytes Relative: 27 % (ref 12–46)
MCH: 28.5 pg (ref 26.0–34.0)
MCHC: 34.7 g/dL (ref 30.0–36.0)
MCV: 82.1 fL (ref 78.0–100.0)
MONO ABS: 2.2 10*3/uL — AB (ref 0.1–1.0)
MONOS PCT: 33 % — AB (ref 3–12)
Neutro Abs: 2.6 10*3/uL (ref 1.7–7.7)
Neutrophils Relative %: 39 % — ABNORMAL LOW (ref 43–77)
PLATELETS: 245 10*3/uL (ref 150–400)
RBC: 4.59 MIL/uL (ref 4.22–5.81)
RDW: 12.3 % (ref 11.5–15.5)
WBC: 6.6 10*3/uL (ref 4.0–10.5)

## 2014-03-11 LAB — LIPASE, BLOOD: Lipase: 193 U/L — ABNORMAL HIGH (ref 11–59)

## 2014-03-11 LAB — CLOSTRIDIUM DIFFICILE BY PCR: Toxigenic C. Difficile by PCR: NEGATIVE

## 2014-03-11 MED ORDER — METRONIDAZOLE 500 MG PO TABS
500.0000 mg | ORAL_TABLET | Freq: Once | ORAL | Status: AC
  Administered 2014-03-11: 500 mg via ORAL
  Filled 2014-03-11: qty 1

## 2014-03-11 MED ORDER — METRONIDAZOLE 500 MG PO TABS: 500.0000 mg | ORAL_TABLET | Freq: Three times a day (TID) | ORAL | Status: DC

## 2014-03-11 MED ORDER — SODIUM CHLORIDE 0.9 % IV BOLUS (SEPSIS)
1000.0000 mL | Freq: Once | INTRAVENOUS | Status: AC
  Administered 2014-03-11: 1000 mL via INTRAVENOUS

## 2014-03-11 MED ORDER — CIPROFLOXACIN HCL 500 MG PO TABS: 500.0000 mg | ORAL_TABLET | Freq: Two times a day (BID) | ORAL | Status: DC

## 2014-03-11 MED ORDER — POTASSIUM CHLORIDE CRYS ER 20 MEQ PO TBCR
40.0000 meq | EXTENDED_RELEASE_TABLET | Freq: Once | ORAL | Status: AC
  Administered 2014-03-11: 40 meq via ORAL
  Filled 2014-03-11: qty 2

## 2014-03-11 MED ORDER — CIPROFLOXACIN HCL 500 MG PO TABS
500.0000 mg | ORAL_TABLET | Freq: Once | ORAL | Status: AC
  Administered 2014-03-11: 500 mg via ORAL
  Filled 2014-03-11: qty 1

## 2014-03-11 NOTE — ED Provider Notes (Signed)
Medical screening examination/treatment/procedure(s) were performed by non-physician practitioner and as supervising physician I was immediately available for consultation/collaboration.   EKG Interpretation None       Barre Aydelott L Janine Reller, MD 03/11/14 2143 

## 2014-03-11 NOTE — Discharge Instructions (Signed)
Continue bentyl for spasms, lomotil only if needed for diarrhea. Take both cipro and flagyl for possible infection until all gone. Your stool cultures are pending, you will be notified if return abnormal. Follow up with gastroenterologist.   Diarrhea Diarrhea is frequent loose and watery bowel movements. It can cause you to feel weak and dehydrated. Dehydration can cause you to become tired and thirsty, have a dry mouth, and have decreased urination that often is dark yellow. Diarrhea is a sign of another problem, most often an infection that will not last long. In most cases, diarrhea typically lasts 2-3 days. However, it can last longer if it is a sign of something more serious. It is important to treat your diarrhea as directed by your caregiver to lessen or prevent future episodes of diarrhea. CAUSES  Some common causes include:  Gastrointestinal infections caused by viruses, bacteria, or parasites.  Food poisoning or food allergies.  Certain medicines, such as antibiotics, chemotherapy, and laxatives.  Artificial sweeteners and fructose.  Digestive disorders. HOME CARE INSTRUCTIONS  Ensure adequate fluid intake (hydration): Have 1 cup (8 oz) of fluid for each diarrhea episode. Avoid fluids that contain simple sugars or sports drinks, fruit juices, whole milk products, and sodas. Your urine should be clear or pale yellow if you are drinking enough fluids. Hydrate with an oral rehydration solution that you can purchase at pharmacies, retail stores, and online. You can prepare an oral rehydration solution at home by mixing the following ingredients together:   - tsp table salt.   tsp baking soda.   tsp salt substitute containing potassium chloride.  1  tablespoons sugar.  1 L (34 oz) of water.  Certain foods and beverages may increase the speed at which food moves through the gastrointestinal (GI) tract. These foods and beverages should be avoided and include:  Caffeinated and  alcoholic beverages.  High-fiber foods, such as raw fruits and vegetables, nuts, seeds, and whole grain breads and cereals.  Foods and beverages sweetened with sugar alcohols, such as xylitol, sorbitol, and mannitol.  Some foods may be well tolerated and may help thicken stool including:  Starchy foods, such as rice, toast, pasta, low-sugar cereal, oatmeal, grits, baked potatoes, crackers, and bagels.  Bananas.  Applesauce.  Add probiotic-rich foods to help increase healthy bacteria in the GI tract, such as yogurt and fermented milk products.  Wash your hands well after each diarrhea episode.  Only take over-the-counter or prescription medicines as directed by your caregiver.  Take a warm bath to relieve any burning or pain from frequent diarrhea episodes. SEEK IMMEDIATE MEDICAL CARE IF:   You are unable to keep fluids down.  You have persistent vomiting.  You have blood in your stool, or your stools are black and tarry.  You do not urinate in 6-8 hours, or there is only a small amount of very dark urine.  You have abdominal pain that increases or localizes.  You have weakness, dizziness, confusion, or light-headedness.  You have a severe headache.  Your diarrhea gets worse or does not get better.  You have a fever or persistent symptoms for more than 2-3 days.  You have a fever and your symptoms suddenly get worse. MAKE SURE YOU:   Understand these instructions.  Will watch your condition.  Will get help right away if you are not doing well or get worse. Document Released: 06/05/2002 Document Revised: 10/30/2013 Document Reviewed: 02/21/2012 [facility] Patient Information 2015 [place], [place]. This information is not intended to replace  advice given to you by your health care provider. Make sure you discuss any questions you have with your health care provider. ° °

## 2014-03-11 NOTE — ED Notes (Signed)
The pt is c/o abd cramps for one week he was seen here and given meds.   He has been constipated  frequnet stools today .  He only has abd pain when he is attempting to have a bm

## 2014-03-11 NOTE — ED Provider Notes (Signed)
CSN: [id number]     Arrival date & time 03/11/14  4098 History   First MD Initiated Contact with Patient 03/11/14 [phone]     Chief Complaint  Patient presents with  . Diarrhea     (Consider location/radiation/quality/duration/timing/severity/associated sxs/prior Treatment) HPI James Blair is a 38 y.o. male who presents to emergency department complaining of diarrhea and abdominal cramping. Patient states he developed diarrhea 5 days ago. States initially was watery and he had associated fever, chills, abdominal pain. He was seen in urgent care, as well as in emergency department 2 days ago for the same. Patient states that his abdominal pain and fever chills have improved. He states his main complaint now is that he continues to have loose stools, states today he has had a bowel movement every hour. He states it is more formed than prior watery diarrhea, however still continues to be loose. He reports that he did see blood in his stool several days ago, that resolved as well. He denies any nausea or vomiting. He has pain only while he is having a bowel movement, states pain is in his rectum and his lower abdomen. He was treated with Lomotil, also was given pain medication and Bentyl which he took with no relief.   History reviewed. No pertinent past medical history. History reviewed. No pertinent past surgical history. No family history on file. History  Substance Use Topics  . Smoking status: Current Some Day Smoker    Last Attempt to Quit: 10/19/2001  . Smokeless tobacco: Not on file  . Alcohol Use: Yes     Comment: occasionally    Review of Systems  Constitutional: Negative for fever and chills.  Respiratory: Negative for cough, chest tightness and shortness of breath.   Cardiovascular: Negative for chest pain, palpitations and leg swelling.  Gastrointestinal: Positive for abdominal pain and diarrhea. Negative for nausea, vomiting and abdominal distention.  Genitourinary: Negative  for dysuria, urgency, frequency and hematuria.  Musculoskeletal: Negative for arthralgias, myalgias, neck pain and neck stiffness.  Skin: Negative for rash.  Allergic/Immunologic: Negative for immunocompromised state.  Neurological: Negative for dizziness, weakness, light-headedness, numbness and headaches.      Allergies  Review of patient's allergies indicates no known allergies.  Home Medications   Prior to Admission medications   Medication Sig Start Date End Date Taking? Authorizing Provider  dicyclomine (BENTYL) 20 MG tablet Take 20 mg by mouth 2 (two) times daily.   Yes Historical Provider, MD  diphenoxylate-atropine (LOMOTIL) 2.5-0.025 MG per tablet Take 1 tablet by mouth 4 (four) times daily as needed for diarrhea or loose stools.   Yes Historical Provider, MD  HYDROcodone-acetaminophen (NORCO/VICODIN) 5-325 MG per tablet Take 1 tablet by mouth every 6 (six) hours as needed for moderate pain.   Yes Historical Provider, MD  ibuprofen (ADVIL,MOTRIN) 200 MG tablet Take 800 mg by mouth every 6 (six) hours as needed for fever, headache or mild pain. For pain   Yes Historical Provider, MD  meloxicam (MOBIC) 15 MG tablet Take 15 mg by mouth daily. 02/26/14  Yes Historical Provider, MD  ondansetron (ZOFRAN-ODT) 8 MG disintegrating tablet Take 8 mg by mouth every 8 (eight) hours as needed for nausea or vomiting.   Yes Historical Provider, MD  promethazine (PHENERGAN) 25 MG tablet Take 25 mg by mouth every 6 (six) hours as needed for nausea or vomiting.   Yes Historical Provider, MD   BP 145/76  Pulse 53  Temp(Src) 98.5 F (36.9 C)  Resp 24  Ht  (1.753 m)  Wt 185 lb (83.915 kg)  BMI 27.31 kg/m2  SpO2 98% Physical Exam  Nursing note and vitals reviewed. Constitutional: He appears well-developed and well-nourished. No distress.  HENT:  Head: Normocephalic and atraumatic.  Eyes: Conjunctivae are normal.  Neck: Neck supple.  Cardiovascular: Normal rate, regular rhythm and  normal heart sounds.   Pulmonary/Chest: Effort normal. No respiratory distress. He has no wheezes. He has no rales.  Abdominal: Soft. Bowel sounds are normal. He exhibits no distension. There is no tenderness. There is no rebound and no guarding.  Musculoskeletal: He exhibits no edema.  Neurological: He is alert.  Skin: Skin is warm and dry.    ED Course  Procedures (including critical care time) Labs Review Labs Reviewed  CBC WITH DIFFERENTIAL - Abnormal; Notable for the following:    HCT 37.7 (*)    Neutrophils Relative % 39 (*)    Monocytes Relative 33 (*)    Monocytes Absolute 2.2 (*)    All other components within normal limits  COMPREHENSIVE METABOLIC PANEL - Abnormal; Notable for the following:    Potassium 3.1 (*)    Glucose, Bld 104 (*)    Albumin 3.3 (*)    All other components within normal limits  LIPASE, BLOOD - Abnormal; Notable for the following:    Lipase 193 (*)    All other components within normal limits  CLOSTRIDIUM DIFFICILE BY PCR  STOOL CULTURE    Imaging Review Dg Abd 2 Views  03/11/2014   CLINICAL DATA:  DIARRHEA  EXAM: ABDOMEN - 2 VIEW  COMPARISON:  None.  FINDINGS: Visualized lung bases clear.  No free air.  Normal bowel gas pattern.  Bilateral pelvic phleboliths.  Regional bones unremarkable.  IMPRESSION: Negative.   Electronically Signed   By: Oley Balm M.D.   On: 03/11/2014 10:07     EKG Interpretation None      MDM   Final diagnoses:  Diarrhea  Elevated lipase  Hypokalemia    Patient emergency department with persistent diarrhea for 5 days. Abdominal cramping but only with bowel movements. On exam abdomen is benign. Vital signs are normal. Will repeat blood work, abdominal x-ray, he is able stool cultures.  11:29 AM Labs unremarkable other than slightly low potassium at 3.1, replaced with 40 mEq by mouth. Lipase is elevated at 193. I discussed findings with Dr.Wentz. Recommending to consult gastroenterology. I spoke with Dr. Elnoria Howard  who advised to perhaps start patient on Flagyl and Cipro given this is his fourth visit and persistent diarrhea. First dose given in emergency department. Patient states he has Lomotil and Bentyl at home. Instructed to continue to take at home, followup with gastroenterology. Referral given. Dr. Elnoria Howard was not sure what to make of lipase, I have hydrated patient with 1 L of IV fluids in emergency department. He will need close recheck. Instructed to return if his symptoms are worsening. At this time no further imaging recommended.  Filed Vitals:   03/11/14 0624 03/11/14 0800 03/11/14 1119  BP: 146/86 145/76 141/64  Pulse: 55 53 70  Temp: 98.5 F (36.9 C)  99.2 F (37.3 C)  TempSrc:   Oral  Resp: 24  20  Height:  (1.753 m)    Weight: 185 lb (83.915 kg)    SpO2: 100% 98% 100%     Lottie Mussel, PA-C 03/11/14 1131

## 2014-03-15 LAB — STOOL CULTURE

## 2018-12-25 ENCOUNTER — Ambulatory Visit (HOSPITAL_COMMUNITY)
Admission: EM | Admit: 2018-12-25 | Discharge: 2018-12-25 | Disposition: A | Discharge status: Home / Self Care | Payer: BLUE CROSS/BLUE SHIELD

## 2018-12-25 ENCOUNTER — Other Ambulatory Visit: Payer: Self-pay

## 2018-12-25 ENCOUNTER — Encounter (HOSPITAL_COMMUNITY): Payer: Self-pay | Admitting: *Deleted

## 2018-12-25 ENCOUNTER — Telehealth: Payer: Self-pay

## 2018-12-25 DIAGNOSIS — R509 Fever, unspecified: Secondary | ICD-10-CM

## 2018-12-25 DIAGNOSIS — Z20822 Contact with and (suspected) exposure to covid-19: Secondary | ICD-10-CM

## 2018-12-25 DIAGNOSIS — R5383 Other fatigue: Secondary | ICD-10-CM

## 2018-12-25 NOTE — ED Provider Notes (Signed)
[facility]    CSN: [id number] Arrival date & time: 12/25/18  1003     History   Chief Complaint No chief complaint on file.   HPI James Blair is a 43 y.o. male no significant past medical history presenting today for evaluation of fever fatigue and chills.  Patient states that yesterday he had a temperature up to 100.3.  He is also had associated chills and fatigue.  Denies URI symptoms of cough, sore throat, congestion.  He has used ibuprofen, but has not taken today.  Denies associated nausea vomiting abdominal pain or diarrhea.  Denies any close sick contacts or known exposure to [place].  HPI  History reviewed. No pertinent past medical history.  There are no active problems to display for this patient.   History reviewed. No pertinent surgical history.     Home Medications    Prior to Admission medications   Medication Sig Start Date End Date Taking? Authorizing Provider  ibuprofen (ADVIL,MOTRIN) 200 MG tablet Take 800 mg by mouth every 6 (six) hours as needed for fever, headache or mild pain. For pain   Yes [provider]  ciprofloxacin (CIPRO) 500 MG tablet Take 1 tablet (500 mg total) by mouth 2 (two) times daily. 03/11/14   Kirichenko, Tatyana, PA-C  dicyclomine (BENTYL) 20 MG tablet Take 20 mg by mouth 2 (two) times daily.    [provider]  diphenoxylate-atropine (LOMOTIL) 2.5-0.025 MG per tablet Take 1 tablet by mouth 4 (four) times daily as needed for diarrhea or loose stools.    [provider]  HYDROcodone-acetaminophen (NORCO/VICODIN) 5-325 MG per tablet Take 1 tablet by mouth every 6 (six) hours as needed for moderate pain.    [provider]  meloxicam (MOBIC) 15 MG tablet Take 15 mg by mouth daily. 02/26/14   [provider]  metroNIDAZOLE (FLAGYL) 500 MG tablet Take 1 tablet (500 mg total) by mouth 3 (three) times daily. 03/11/14   Kirichenko, Tatyana, PA-C  ondansetron (ZOFRAN-ODT) 8 MG disintegrating  tablet Take 8 mg by mouth every 8 (eight) hours as needed for nausea or vomiting.    [provider]  promethazine (PHENERGAN) 25 MG tablet Take 25 mg by mouth every 6 (six) hours as needed for nausea or vomiting.    [provider]    Family History Family History  Problem Relation Age of Onset  . Heart murmur Mother   . Healthy Father     Social History Social History   Tobacco Use  . Smoking status: Never Smoker  . Smokeless tobacco: Never Used  Substance Use Topics  . Alcohol use: Yes    Comment: occasionally  . Drug use: Yes    Comment: marijuana     Allergies   Patient has no known allergies.   Review of Systems Review of Systems  Constitutional: Positive for appetite change, chills, fatigue and fever. Negative for activity change.  HENT: Negative for congestion, ear pain, rhinorrhea, sinus pressure, sore throat and trouble swallowing.   Eyes: Negative for discharge and redness.  Respiratory: Negative for cough, chest tightness and shortness of breath.   Cardiovascular: Negative for chest pain.  Gastrointestinal: Negative for abdominal pain, diarrhea, nausea and vomiting.  Musculoskeletal: Negative for myalgias.  Skin: Negative for rash.  Neurological: Negative for dizziness, light-headedness and headaches.     Physical Exam Triage Vital Signs ED Triage Vitals  Enc Vitals Group     BP      Pulse  Resp      Temp      Temp src      SpO2      Weight      Height      Head Circumference      Peak Flow      Pain Score      Pain Loc      Pain Edu?      Excl. in [place]?    No data found.  Updated Vital Signs BP 111/73   Pulse 68   Temp 99.8 F (37.7 C) (Temporal)   Resp 18   SpO2 100%   Visual Acuity Right Eye Distance:   Left Eye Distance:   Bilateral Distance:    Right Eye Near:   Left Eye Near:    Bilateral Near:     Physical Exam Vitals signs and nursing note reviewed.  Constitutional:      Appearance: He is  well-developed.  HENT:     Head: Normocephalic and atraumatic.     Ears:     Comments: Bilateral ears without tenderness to palpation of external auricle, tragus and mastoid, EAC's without erythema or swelling, TM's with good bony landmarks and cone of light. Non erythematous.     Mouth/Throat:     Comments: Oral mucosa pink and moist, no tonsillar enlargement or exudate. Posterior pharynx patent and nonerythematous, no uvula deviation or swelling. Normal phonation. Eyes:     Conjunctiva/sclera: Conjunctivae normal.  Neck:     Musculoskeletal: Neck supple.  Cardiovascular:     Rate and Rhythm: Normal rate and regular rhythm.     Heart sounds: No murmur.  Pulmonary:     Effort: Pulmonary effort is normal. No respiratory distress.     Breath sounds: Normal breath sounds.     Comments: Breathing comfortably at rest, CTABL, no wheezing, rales or other adventitious sounds auscultated Abdominal:     Palpations: Abdomen is soft.     Tenderness: There is no abdominal tenderness.     Comments: Soft, nondistended, nontender to light deep palpation throughout entire abdomen  Skin:    General: Skin is warm and dry.  Neurological:     Mental Status: He is alert.      UC Treatments / Results  Labs (all labs ordered are listed, but only abnormal results are displayed) Labs Reviewed - No data to display  EKG None  Radiology No results found.  Procedures Procedures (including critical care time)  Medications Ordered in UC Medications - No data to display  Initial Impression / Assessment and Plan / UC Course  I have reviewed the triage vital signs and the nursing notes.  Pertinent labs & imaging results that were available during my care of the patient were reviewed by me and considered in my medical decision making (see chart for details).     Patient with 1 to 2 days of low-grade fevers, body aches and chills, no associated respiratory symptoms or GI upset.  Possible COVID  given fever.  Recommend COVID testing.  Advised to stay home and limit contact others until testing results return.  Rest, drink plenty of fluids, Tylenol and ibuprofen for fevers.  Follow-up if symptoms changing, progressing, developing shortness of breath or difficulty breathing.Discussed strict return precautions. Patient verbalized understanding and is agreeable with plan.  Final Clinical Impressions(s) / UC Diagnoses   Final diagnoses:  Fever, unspecified  Fatigue, unspecified type     Discharge Instructions     Please alternate Tylenol and  ibuprofen every 4 hours for better management of fevers Rest and drink plenty of fluids Someone will be in contact with you to set up COVID testing in the morning [address] Please stay at home and limit contact with others until results obtained  Please follow-up if symptoms changing, developing cough, shortness of breath, difficulty breathing, persistent fevers, dizziness or lightheadedness    ED Prescriptions    None     Controlled Substance Prescriptions [place] Controlled Substance Registry consulted? Not Applicable   Lew DawesWieters, Vernella Niznik C, [place]PA-C 12/25/18 1052

## 2018-12-25 NOTE — Telephone Encounter (Signed)
Called pt and appt scheduled for 12/26/18 at 1:00 pm at [facility]. Pt advised to wear a mask to the testing site and to stay in car. Address of testing site provided. Order placed.

## 2018-12-25 NOTE — Discharge Instructions (Signed)
Please alternate Tylenol and ibuprofen every 4 hours for better management of fevers Rest and drink plenty of fluids Someone will be in contact with you to set up COVID testing in the morning [place]. Please stay at home and limit contact with others until results obtained  Please follow-up if symptoms changing, developing cough, shortness of breath, difficulty breathing, persistent fevers, dizziness or lightheadedness

## 2018-12-25 NOTE — ED Triage Notes (Signed)
C/O fatigue and chills since yesterday with temp up to 100.3.  Denies cough, sore throat, or any other c/o's.  Has been taking IBU - no doses today.

## 2018-12-25 NOTE — Telephone Encounter (Signed)
-----   Message from Janith Lima, PA-C sent at 12/25/2018 10:31 AM EDT ----- Regarding: Needs COVID testing Fever, fatigue and chills x 2 days, no known exposure

## 2018-12-26 ENCOUNTER — Other Ambulatory Visit: Payer: BLUE CROSS/BLUE SHIELD

## 2018-12-26 DIAGNOSIS — Z20822 Contact with and (suspected) exposure to covid-19: Secondary | ICD-10-CM

## 2018-12-29 ENCOUNTER — Ambulatory Visit: Payer: Self-pay

## 2018-12-29 LAB — NOVEL CORONAVIRUS, NAA: SARS-CoV-2, NAA: DETECTED — AB

## 2018-12-29 NOTE — Telephone Encounter (Signed)
Charted in result notes.   Summary: please advise   Pt receive covid 19 result from mychart and the results are positive. Pt would like a nurse to explain to him

## 2019-07-04 ENCOUNTER — Ambulatory Visit (HOSPITAL_COMMUNITY)
Admission: EM | Admit: 2019-07-04 | Discharge: 2019-07-04 | Disposition: A | Discharge status: Home / Self Care | Payer: Commercial Managed Care - PPO | Attending: Physician Assistant | Admitting: Physician Assistant

## 2019-07-04 ENCOUNTER — Ambulatory Visit (INDEPENDENT_AMBULATORY_CARE_PROVIDER_SITE_OTHER): Payer: Commercial Managed Care - PPO

## 2019-07-04 ENCOUNTER — Encounter (HOSPITAL_COMMUNITY): Payer: Self-pay

## 2019-07-04 ENCOUNTER — Other Ambulatory Visit: Payer: Self-pay

## 2019-07-04 DIAGNOSIS — S99921A Unspecified injury of right foot, initial encounter: Secondary | ICD-10-CM

## 2019-07-04 MED ORDER — IBUPROFEN 800 MG PO TABS: 800.0000 mg | ORAL_TABLET | Freq: Three times a day (TID) | ORAL | 0 refills | Status: DC

## 2019-07-04 NOTE — ED Triage Notes (Signed)
Pt. States he hit the wall & his pinky toe(right foot). This morning when he was in the shower he rubs the water off his toes & states he heard a "crack then pop"  & immediately started swelling & hurting. Ever since he can NOT bare weight on it.

## 2019-07-04 NOTE — Discharge Instructions (Addendum)
Take the ibuprofen 3 times a day for 2-3 days scheduled, and then as needed for pain  Ice and elevate tonight  Activity as tolerated

## 2019-07-04 NOTE — ED Provider Notes (Signed)
[facility]    CSN: [id number] Arrival date & time: 07/04/19  1055      History   Chief Complaint Chief Complaint  Patient presents with  . Toe Injury    HPI James Blair is a 44 y.o. male.   Patient reports to urgent care today for right little toe injury. Patient reports striking the toe against a wall 2-3 days ago and had some moderate pain at the time but it has not bothered him much until this morning. He notes after showering he was drying his feet and moved his little toe and hear a "crack or pop" and had a great deal of pain and noticed swelling. He reports moving the toe laterally with slight external rotation when this happened. He has since had pain with bearing weight. He has not taken any medications for it. He has never injured this toe before.      History reviewed. No pertinent past medical history.  There are no problems to display for this patient.   History reviewed. No pertinent surgical history.     Home Medications    Prior to Admission medications   Medication Sig Start Date End Date Taking? Authorizing Provider  ciprofloxacin (CIPRO) 500 MG tablet Take 1 tablet (500 mg total) by mouth 2 (two) times daily. 03/11/14   Kirichenko, Tatyana, PA-C  dicyclomine (BENTYL) 20 MG tablet Take 20 mg by mouth 2 (two) times daily.    [provider]  diphenoxylate-atropine (LOMOTIL) 2.5-0.025 MG per tablet Take 1 tablet by mouth 4 (four) times daily as needed for diarrhea or loose stools.    [provider]  HYDROcodone-acetaminophen (NORCO/VICODIN) 5-325 MG per tablet Take 1 tablet by mouth every 6 (six) hours as needed for moderate pain.    [provider]  ibuprofen (ADVIL) 800 MG tablet Take 1 tablet (800 mg total) by mouth 3 (three) times daily. 07/04/19   Mayur Duman, Veryl Speak, PA-C  meloxicam (MOBIC) 15 MG tablet Take 15 mg by mouth daily. 02/26/14   [provider]  metroNIDAZOLE (FLAGYL) 500 MG tablet Take 1 tablet (500  mg total) by mouth 3 (three) times daily. 03/11/14   Kirichenko, Tatyana, PA-C  ondansetron (ZOFRAN-ODT) 8 MG disintegrating tablet Take 8 mg by mouth every 8 (eight) hours as needed for nausea or vomiting.    [provider]  promethazine (PHENERGAN) 25 MG tablet Take 25 mg by mouth every 6 (six) hours as needed for nausea or vomiting.    [provider]    Family History Family History  Problem Relation Age of Onset  . Heart murmur Mother   . Healthy Father     Social History Social History   Tobacco Use  . Smoking status: Never Smoker  . Smokeless tobacco: Never Used  Substance Use Topics  . Alcohol use: Yes    Comment: occasionally  . Drug use: Yes    Types: Marijuana     Allergies   Patient has no known allergies.   Review of Systems Review of Systems  Constitutional: Positive for activity change. Negative for fever.  Eyes: Negative for pain and visual disturbance.  Musculoskeletal: Positive for arthralgias, gait problem and joint swelling. Negative for back pain.  Skin: Positive for color change. Negative for rash.  Neurological: Negative for numbness.  All other systems reviewed and are negative.    Physical Exam Triage Vital Signs ED Triage Vitals [07/04/19 1215]  Enc Vitals Group     BP  Pulse      Resp      Temp      Temp src      SpO2      Weight 202 lb 6.4 oz (91.8 kg)     Height      Head Circumference      Peak Flow      Pain Score 2     Pain Loc      Pain Edu?      Excl. in [place]?    No data found.  Updated Vital Signs BP 135/73 (BP Location: Left Arm)   Pulse 70   Temp 98.3 F (36.8 C) (Oral)   Resp 18   Wt 202 lb 6.4 oz (91.8 kg)   SpO2 98%   BMI 29.89 kg/m   Visual Acuity Right Eye Distance:   Left Eye Distance:   Bilateral Distance:    Right Eye Near:   Left Eye Near:    Bilateral Near:     Physical Exam Vitals and nursing note reviewed.  Constitutional:      General: He is not in acute  distress.    Appearance: Normal appearance. He is well-developed and normal weight.  HENT:     Head: Normocephalic and atraumatic.  Eyes:     Conjunctiva/sclera: Conjunctivae normal.     Pupils: Pupils are equal, round, and reactive to light.  Cardiovascular:     Rate and Rhythm: Normal rate.  Pulmonary:     Effort: Pulmonary effort is normal. No respiratory distress.  Musculoskeletal:     Cervical back: Normal range of motion.     Right lower leg: No edema.     Left lower leg: No edema.     Right foot: Decreased range of motion. Swelling, tenderness and bony tenderness present.     Left foot: Normal.     Comments: TTP over entire 5th toe at PIP and MTP.Pain with ROM. Mild edema when comparing to left. Sensation intact, cap refill <2.   Skin:    General: Skin is warm and dry.  Neurological:     Mental Status: He is alert.      UC Treatments / Results  Labs (all labs ordered are listed, but only abnormal results are displayed) Labs Reviewed - No data to display  EKG   Radiology DG Foot Complete Right  Result Date: 07/04/2019 CLINICAL DATA:  Right fifth toe injury EXAM: RIGHT FOOT COMPLETE - 3+ VIEW COMPARISON:  None. FINDINGS: There is no evidence of fracture or dislocation. There is no evidence of arthropathy or other focal bone abnormality. Soft tissues swelling over the lateral aspect of the forefoot. IMPRESSION: Soft tissue swelling without acute fracture. Electronically Signed   By: Davina Poke D.O.   On: 07/04/2019 12:39    Procedures Procedures (including critical care time)  Medications Ordered in UC Medications - No data to display  Initial Impression / Assessment and Plan / UC Course  I have reviewed the triage vital signs and the nursing notes.  Pertinent labs & imaging results that were available during my care of the patient were reviewed by me and considered in my medical decision making (see chart for details).     # Toe Injury - No bone or  joint pathology. Based on history, believe there was a temporary dislocation with immediate relocation. Treating with NSAIDs, Ice and activity restriction as tolerated.    Final Clinical Impressions(s) / UC Diagnoses   Final diagnoses:  Toe injury, right,  initial encounter     Discharge Instructions     Take the ibuprofen 3 times a day for 2-3 days scheduled, and then as needed for pain  Ice and elevate tonight  Activity as tolerated        ED Prescriptions    Medication Sig Dispense Auth. Provider   ibuprofen (ADVIL) 800 MG tablet Take 1 tablet (800 mg total) by mouth 3 (three) times daily. 21 tablet [address] Kjos, Veryl Speak, PA-C     PDMP not reviewed this encounter.   Hermelinda Medicus, PA-C 07/04/19 1258

## 2020-02-14 ENCOUNTER — Other Ambulatory Visit: Payer: BLUE CROSS/BLUE SHIELD

## 2020-02-14 ENCOUNTER — Other Ambulatory Visit: Payer: Self-pay

## 2020-02-14 DIAGNOSIS — Z20822 Contact with and (suspected) exposure to covid-19: Secondary | ICD-10-CM

## 2020-02-16 LAB — SARS-COV-2, NAA 2 DAY TAT

## 2020-02-16 LAB — NOVEL CORONAVIRUS, NAA: SARS-CoV-2, NAA: NOT DETECTED

## 2021-03-16 IMAGING — DX DG FOOT COMPLETE 3+V*R*
3 series · 3 of 3 positions shown · non-contrast
Comparison: None.

CLINICAL DATA: Right fifth toe injury

EXAM:
RIGHT FOOT COMPLETE - 3+ VIEW

[foot ap]
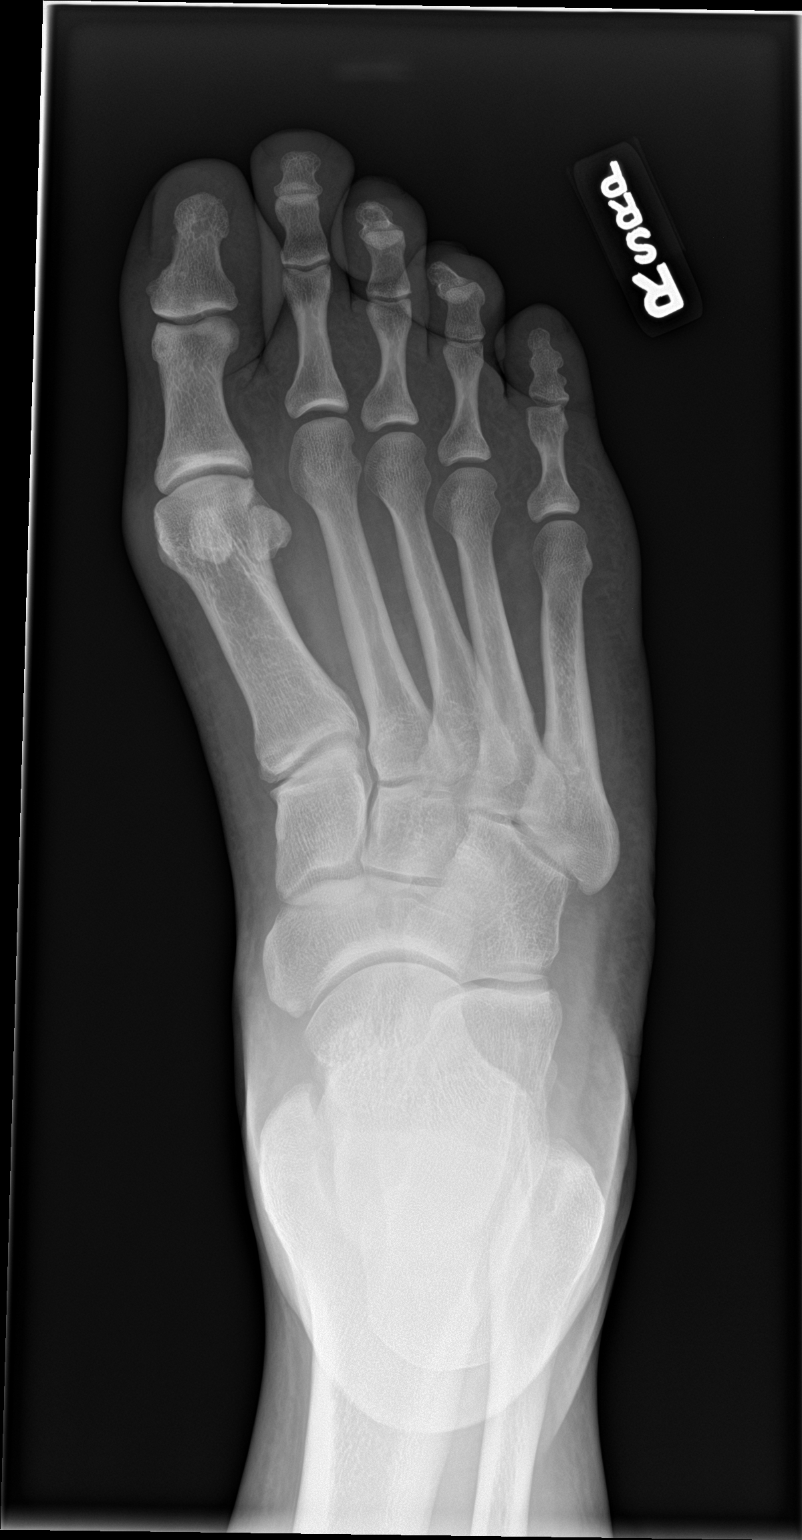

[foot obl]
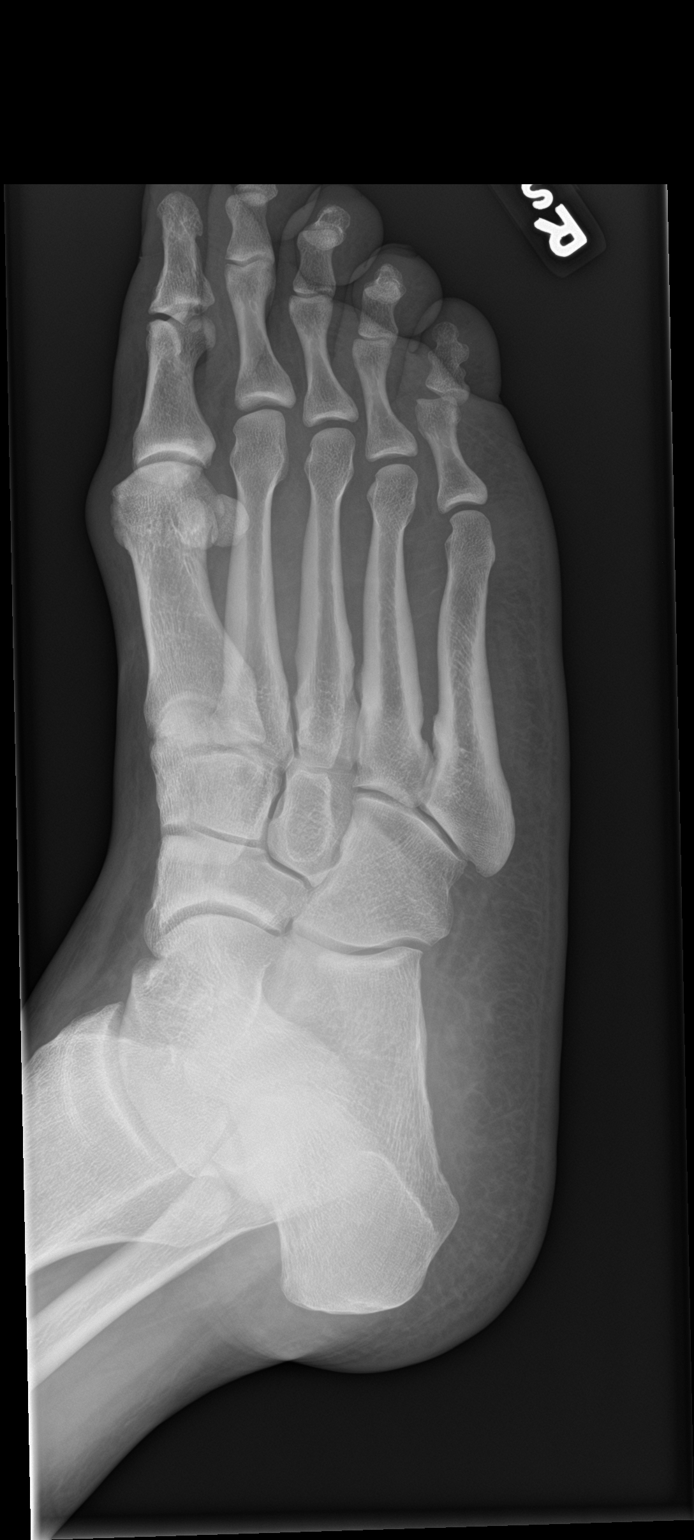

[foot lat]
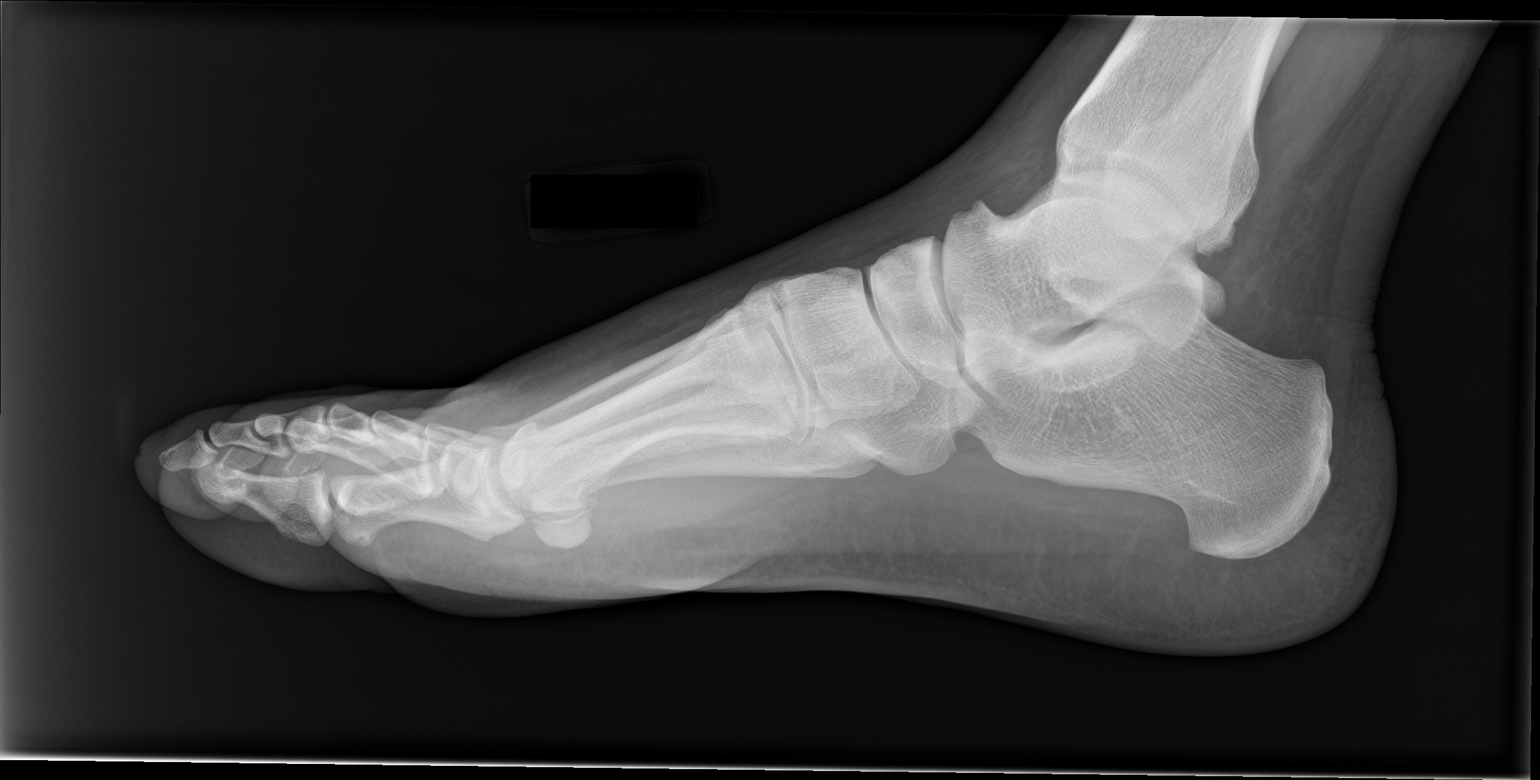

[3 of 3 positions shown; findings below may reference images not displayed]

FINDINGS: There is no evidence of fracture or dislocation. There is no
evidence of arthropathy or other focal bone abnormality. Soft
tissues swelling over the lateral aspect of the forefoot.
IMPRESSION: Soft tissue swelling without acute fracture.

## 2022-04-14 ENCOUNTER — Encounter (HOSPITAL_COMMUNITY): Payer: Self-pay | Admitting: Emergency Medicine

## 2022-04-14 ENCOUNTER — Other Ambulatory Visit: Payer: Self-pay

## 2022-04-14 ENCOUNTER — Ambulatory Visit (HOSPITAL_COMMUNITY)
Admission: EM | Admit: 2022-04-14 | Discharge: 2022-04-14 | Disposition: A | Discharge status: Home / Self Care | Payer: Commercial Managed Care - PPO | Attending: Physician Assistant | Admitting: Physician Assistant

## 2022-04-14 DIAGNOSIS — K529 Noninfective gastroenteritis and colitis, unspecified: Secondary | ICD-10-CM | POA: Diagnosis not present

## 2022-04-14 MED ORDER — ONDANSETRON 4 MG PO TBDP
ORAL_TABLET | ORAL | Status: AC
  Filled 2022-04-14: qty 1

## 2022-04-14 MED ORDER — ONDANSETRON 4 MG PO TBDP: 4.0000 mg | ORAL_TABLET | Freq: Three times a day (TID) | ORAL | 0 refills | Status: DC | PRN

## 2022-04-14 MED ORDER — ONDANSETRON 4 MG PO TBDP
4.0000 mg | ORAL_TABLET | Freq: Once | ORAL | Status: AC
  Administered 2022-04-14: 4 mg via ORAL

## 2022-04-14 NOTE — ED Provider Notes (Signed)
[facility]    CSN: [id number] Arrival date & time: 04/14/22  1841      History   Chief Complaint Chief Complaint  Patient presents with   URI    HPI James Blair is a 46 y.o. male.   Patient here today for evaluation of continued nausea and chills after initially having nausea, diarrhea and vomiting 8 days ago.  He reports that with time symptoms have improved and he has not had fever recently.  He has been able to eat and hold foods down but does admit to eating spicy foods as well as salty foods and fried foods.  He states overall he feels "unwell".  He denies any abdominal pain. He has taken pepto bismol with some relief. Stool is now more solid.   The history is provided by the patient.    History reviewed. No pertinent past medical history.  There are no problems to display for this patient.   History reviewed. No pertinent surgical history.     Home Medications    Prior to Admission medications   Medication Sig Start Date End Date Taking? Authorizing Provider  ondansetron (ZOFRAN-ODT) 4 MG disintegrating tablet Take 1 tablet (4 mg total) by mouth every 8 (eight) hours as needed for nausea or vomiting. 04/14/22  Yes Tomi Bamberger, PA-C  ciprofloxacin (CIPRO) 500 MG tablet Take 1 tablet (500 mg total) by mouth 2 (two) times daily. Patient not taking: Reported on 04/14/2022 03/11/14   Jaynie Crumble, PA-C  dicyclomine (BENTYL) 20 MG tablet Take 20 mg by mouth 2 (two) times daily.    [provider]  diphenoxylate-atropine (LOMOTIL) 2.5-0.025 MG per tablet Take 1 tablet by mouth 4 (four) times daily as needed for diarrhea or loose stools.    [provider]  HYDROcodone-acetaminophen (NORCO/VICODIN) 5-325 MG per tablet Take 1 tablet by mouth every 6 (six) hours as needed for moderate pain. Patient not taking: Reported on 04/14/2022    [provider]  ibuprofen (ADVIL) 800 MG tablet Take 1 tablet (800 mg total) by mouth 3  (three) times daily. 07/04/19   Darr, Gerilyn Pilgrim, PA-C  meloxicam (MOBIC) 15 MG tablet Take 15 mg by mouth daily. 02/26/14   [provider]  metroNIDAZOLE (FLAGYL) 500 MG tablet Take 1 tablet (500 mg total) by mouth 3 (three) times daily. Patient not taking: Reported on 04/14/2022 03/11/14   Jaynie Crumble, PA-C  promethazine (PHENERGAN) 25 MG tablet Take 25 mg by mouth every 6 (six) hours as needed for nausea or vomiting.    [provider]    Family History Family History  Problem Relation Age of Onset   Heart murmur Mother    Healthy Father     Social History Social History   Tobacco Use   Smoking status: Never   Smokeless tobacco: Never  Vaping Use   Vaping Use: Never used  Substance Use Topics   Alcohol use: Yes    Comment: occasionally   Drug use: Yes    Types: Marijuana     Allergies   Patient has no known allergies.   Review of Systems Review of Systems  Constitutional:  Negative for chills and fever.  HENT:  Negative for congestion and rhinorrhea.   Eyes:  Negative for discharge and redness.  Respiratory:  Negative for cough and shortness of breath.   Gastrointestinal:  Positive for nausea. Negative for abdominal pain, diarrhea and vomiting.  Neurological:  Negative for numbness.     Physical Exam  Triage Vital Signs ED Triage Vitals  Enc Vitals Group     BP      Pulse      Resp      Temp      Temp src      SpO2      Weight      Height      Head Circumference      Peak Flow      Pain Score      Pain Loc      Pain Edu?      Excl. in [place]?    No data found.  Updated Vital Signs BP 123/65 (BP Location: Left Arm)   Pulse 62   Temp 99 F (37.2 C) (Oral)   Resp (!) 22   SpO2 100%      Physical Exam Vitals and nursing note reviewed.  Constitutional:      General: He is not in acute distress.    Appearance: Normal appearance. He is not ill-appearing.  HENT:     Head: Normocephalic and atraumatic.  Eyes:      Conjunctiva/sclera: Conjunctivae normal.  Cardiovascular:     Rate and Rhythm: Normal rate and regular rhythm.     Heart sounds: Normal heart sounds.  Pulmonary:     Effort: Pulmonary effort is normal. No respiratory distress.     Breath sounds: Normal breath sounds.  Abdominal:     General: Abdomen is flat. Bowel sounds are normal. There is no distension.     Palpations: Abdomen is soft.     Tenderness: There is no abdominal tenderness. There is no guarding or rebound.  Neurological:     Mental Status: He is alert.  Psychiatric:        Mood and Affect: Mood normal.        Behavior: Behavior normal.        Thought Content: Thought content normal.      UC Treatments / Results  Labs (all labs ordered are listed, but only abnormal results are displayed) Labs Reviewed - No data to display  EKG   Radiology No results found.  Procedures Procedures (including critical care time)  Medications Ordered in UC Medications - No data to display  Initial Impression / Assessment and Plan / UC Course  I have reviewed the triage vital signs and the nursing notes.  Pertinent labs & imaging results that were available during my care of the patient were reviewed by me and considered in my medical decision making (see chart for details).    Discussed viral gastroenteritis versus foodborne illness.  Symptoms seem to be improving, will prescribe Zofran to help with nausea and recommended increase fluids.  Encouraged patient to avoid fried foods, spicy foods or salty foods.  Patient expresses understanding.  Discussed if symptoms continue, specifically diarrhea that he may need stool culture.  Patient expresses understanding.  Final Clinical Impressions(s) / UC Diagnoses   Final diagnoses:  Gastroenteritis   Discharge Instructions   None    ED Prescriptions     Medication Sig Dispense Auth. Provider   ondansetron (ZOFRAN-ODT) 4 MG disintegrating tablet Take 1 tablet (4 mg total) by  mouth every 8 (eight) hours as needed for nausea or vomiting. 20 tablet Francene Finders, PA-C      PDMP not reviewed this encounter.   Francene Finders, PA-C 04/14/22 1924

## 2022-04-14 NOTE — ED Triage Notes (Signed)
04/06/2022-symptoms.  Initially nausea, diarrhea, chills, vomited x 2 times.  By the end of the week, started feeling better.  Currently, still feels unwell.  Continues to have some nausea.

## 2022-06-16 ENCOUNTER — Ambulatory Visit (HOSPITAL_COMMUNITY)
Admission: EM | Admit: 2022-06-16 | Discharge: 2022-06-16 | Disposition: A | Discharge status: Home / Self Care | Payer: Commercial Managed Care - PPO

## 2022-06-16 ENCOUNTER — Encounter (HOSPITAL_COMMUNITY): Payer: Self-pay | Admitting: *Deleted

## 2022-06-16 DIAGNOSIS — J0110 Acute frontal sinusitis, unspecified: Secondary | ICD-10-CM | POA: Diagnosis not present

## 2022-06-16 DIAGNOSIS — J069 Acute upper respiratory infection, unspecified: Secondary | ICD-10-CM | POA: Diagnosis not present

## 2022-06-16 MED ORDER — FLUTICASONE PROPIONATE 50 MCG/ACT NA SUSP: 2.0000 | Freq: Every day | NASAL | 2 refills | Status: DC

## 2022-06-16 MED ORDER — AMOXICILLIN 500 MG PO CAPS: 500.0000 mg | ORAL_CAPSULE | Freq: Three times a day (TID) | ORAL | 0 refills | Status: DC

## 2022-06-16 NOTE — Discharge Instructions (Signed)
Advise use of Flonase nasal spray, 2 sprays each nostril once daily to help decrease sinus congestion. Advised to take the oxacillin 500 [employer] every 8 hours andCompleted to treat theThis infection Follow-up PCP or return to urgent care if symptoms fail to improve.

## 2022-06-16 NOTE — ED Provider Notes (Signed)
[facility]    CSN: [id number] Arrival date & time: 06/16/22  1206      History   Chief Complaint Chief Complaint  Patient presents with   Cough    HPI James Blair is a 46 y.o. male.   46 year old male presents with sinus congestion, cough, fatigue.  Patient indicates for the past 3 days he has been having some upper respiratory congestion with frontal and maxillary sinus pain, pressure and tenderness with rhinitis and production mainly being purulent.  Patient also indicates he has been having some mild postnasal drip associated.  He also indicates having mild chest congestion with intermittent cough, production has been clear to yellow.  Patient indicates for the first couple days he did have some fever, chills, body aches and discomfort.  He does indicate that he was seen 3 days ago at urgent care and had flu, COVID, and strept test which were all negative.  Patient indicates that he was given prescription for Hydromet to treat the chest congestion and cough.  Patient indicates yesterday he did a repeat COVID test and it was also negative.  Patient indicates he continues to have a lot of upper respiratory sinus congestion, pressure and tenderness with discoloration of the production.  Patient denies any nausea or vomiting, and no wheezing or shortness of breath.   Cough Associated symptoms: rhinorrhea     History reviewed. No pertinent past medical history.  There are no problems to display for this patient.   History reviewed. No pertinent surgical history.     Home Medications    Prior to Admission medications   Medication Sig Start Date End Date Taking? Authorizing Provider  amoxicillin (AMOXIL) 500 MG capsule Take 1 capsule (500 mg total) by mouth 3 (three) times daily. 06/16/22  Yes Ellsworth Lennox, PA-C  fluticasone [facility]) 50 MCG/ACT nasal spray Place 2 sprays into both nostrils daily. 06/16/22  Yes Ellsworth Lennox, PA-C  HYDROMET 5-1.5 MG/5ML syrup Take  5 mL every 4 hours by oral route as needed. 06/13/22  Yes [provider]  ciprofloxacin (CIPRO) 500 MG tablet Take 1 tablet (500 mg total) by mouth 2 (two) times daily. Patient not taking: Reported on 04/14/2022 03/11/14   Jaynie Crumble, PA-C  dicyclomine (BENTYL) 20 MG tablet Take 20 mg by mouth 2 (two) times daily.    [provider]  diphenoxylate-atropine (LOMOTIL) 2.5-0.025 MG per tablet Take 1 tablet by mouth 4 (four) times daily as needed for diarrhea or loose stools.    [provider]  HYDROcodone-acetaminophen (NORCO/VICODIN) 5-325 MG per tablet Take 1 tablet by mouth every 6 (six) hours as needed for moderate pain. Patient not taking: Reported on 04/14/2022    [provider]  ibuprofen (ADVIL) 800 MG tablet Take 1 tablet (800 mg total) by mouth 3 (three) times daily. 07/04/19   Darr, Gerilyn Pilgrim, PA-C  meloxicam (MOBIC) 15 MG tablet Take 15 mg by mouth daily. 02/26/14   [provider]  metroNIDAZOLE (FLAGYL) 500 MG tablet Take 1 tablet (500 mg total) by mouth 3 (three) times daily. Patient not taking: Reported on 04/14/2022 03/11/14   Jaynie Crumble, PA-C  ondansetron (ZOFRAN-ODT) 4 MG disintegrating tablet Take 1 tablet (4 mg total) by mouth every 8 (eight) hours as needed for nausea or vomiting. 04/14/22   Tomi Bamberger, PA-C  promethazine (PHENERGAN) 25 MG tablet Take 25 mg by mouth every 6 (six) hours as needed for nausea or vomiting.    [provider]  Family History Family History  Problem Relation Age of Onset   Heart murmur Mother    Healthy Father     Social History Social History   Tobacco Use   Smoking status: Never   Smokeless tobacco: Never  Vaping Use   Vaping Use: Never used  Substance Use Topics   Alcohol use: Yes    Comment: occasionally   Drug use: Yes    Types: Marijuana     Allergies   Patient has no known allergies.   Review of Systems Review of Systems  HENT:  Positive for  postnasal drip, rhinorrhea, sinus pressure and sinus pain.   Respiratory:  Positive for cough.      Physical Exam Triage Vital Signs ED Triage Vitals  Enc Vitals Group     BP 06/16/22 1638 (!) 144/83     Pulse Rate 06/16/22 1638 66     Resp 06/16/22 1638 18     Temp 06/16/22 1638 98.9 F (37.2 C)     Temp Source 06/16/22 1638 Oral     SpO2 06/16/22 1638 98 %     Weight --      Height --      Head Circumference --      Peak Flow --      Pain Score 06/16/22 1637 0     Pain Loc --      Pain Edu? --      Excl. in [place]? --    No data found.  Updated Vital Signs BP (!) 144/83 (BP Location: Right Arm)   Pulse 66   Temp 98.9 F (37.2 C) (Oral)   Resp 18   SpO2 98%   Visual Acuity Right Eye Distance:   Left Eye Distance:   Bilateral Distance:    Right Eye Near:   Left Eye Near:    Bilateral Near:     Physical Exam Constitutional:      Appearance: Normal appearance.  HENT:     Right Ear: Ear canal normal. Tympanic membrane is injected.     Left Ear: Ear canal normal. Tympanic membrane is injected.     Mouth/Throat:     Mouth: Mucous membranes are moist.     Pharynx: Oropharynx is clear.  Cardiovascular:     Rate and Rhythm: Normal rate and regular rhythm.     Heart sounds: Normal heart sounds.  Pulmonary:     Effort: Pulmonary effort is normal.     Breath sounds: Normal breath sounds and air entry. No wheezing, rhonchi or rales.  Lymphadenopathy:     Cervical: No cervical adenopathy.  Neurological:     Mental Status: He is alert.      UC Treatments / Results  Labs (all labs ordered are listed, but only abnormal results are displayed) Labs Reviewed - No data to display  EKG   Radiology No results found.  Procedures Procedures (including critical care time)  Medications Ordered in UC Medications - No data to display  Initial Impression / Assessment and Plan / UC Course  I have reviewed the triage vital signs and the nursing notes.  Pertinent  labs & imaging results that were available during my care of the patient were reviewed by me and considered in my medical decision making (see chart for details).    Plan: 1.  The upper respiratory tract infection will be treated with the following: A.  Advised patient to take Mucinex DM OTC for the cough and congestion. 2.  The frontal  sinusitis will be treated with the following: A.  Flonase nasal spray, 2 sprays each nostril once daily to treat the sinus congestion. B.  Amoxicillin 500 mg every 8 hours until completed to treat the sinus infection. 3.  Patient advised follow-up PCP or return to urgent care if symptoms fail to improve. Final Clinical Impressions(s) / UC Diagnoses   Final diagnoses:  Viral upper respiratory tract infection  Acute non-recurrent frontal sinusitis     Discharge Instructions      Advise use of Flonase nasal spray, 2 sprays each nostril once daily to help decrease sinus congestion. Advised to take the oxacillin 500 [employer] every 8 hours andCompleted to treat theThis infection Follow-up PCP or return to urgent care if symptoms fail to improve.    ED Prescriptions     Medication Sig Dispense Auth. Provider   fluticasone (FLONASE) 50 MCG/ACT nasal spray Place 2 sprays into both nostrils daily. 11.1 mL Ellsworth Lennox, PA-C   amoxicillin (AMOXIL) 500 MG capsule Take 1 capsule (500 mg total) by mouth 3 (three) times daily. 21 capsule Ellsworth Lennox, PA-C      PDMP not reviewed this encounter.   Ellsworth Lennox, PA-C 06/16/22 1703

## 2022-06-16 NOTE — ED Triage Notes (Signed)
Pt states cough and congestion since Saturday. He did go to UC on Saturday and was given Hydromet it just makes him sleepy. They tested him for Flu, COVID, strep all neg. He did an at home COVID testes yesterday and it was Neg.

## 2022-07-09 ENCOUNTER — Other Ambulatory Visit: Payer: Self-pay

## 2022-07-09 ENCOUNTER — Ambulatory Visit (HOSPITAL_COMMUNITY)
Admission: EM | Admit: 2022-07-09 | Discharge: 2022-07-09 | Disposition: A | Discharge status: Home / Self Care | Payer: Commercial Managed Care - PPO | Attending: Family Medicine | Admitting: Family Medicine

## 2022-07-09 ENCOUNTER — Encounter (HOSPITAL_COMMUNITY): Payer: Self-pay | Admitting: Emergency Medicine

## 2022-07-09 DIAGNOSIS — J069 Acute upper respiratory infection, unspecified: Secondary | ICD-10-CM | POA: Insufficient documentation

## 2022-07-09 DIAGNOSIS — Z1152 Encounter for screening for COVID-19: Secondary | ICD-10-CM | POA: Insufficient documentation

## 2022-07-09 MED ORDER — BENZONATATE 100 MG PO CAPS: 100.0000 mg | ORAL_CAPSULE | Freq: Three times a day (TID) | ORAL | 0 refills | Status: DC | PRN

## 2022-07-09 MED ORDER — DEXAMETHASONE SODIUM PHOSPHATE 10 MG/ML IJ SOLN
20.0000 mg | Freq: Once | INTRAMUSCULAR | Status: AC
  Administered 2022-07-09: 20 mg via INTRAMUSCULAR

## 2022-07-09 MED ORDER — DEXAMETHASONE SODIUM PHOSPHATE 10 MG/ML IJ SOLN
INTRAMUSCULAR | Status: AC
  Filled 2022-07-09: qty 2

## 2022-07-09 NOTE — ED Triage Notes (Signed)
Symptoms started on Tuesday 07/07/2022.  Patient is coughing up phlegm, sinus congestion, cough, and scratchy throat.  Reports having a sinus infection one month ago.  Patient did miss one day of antibiotic treatment at that time.

## 2022-07-09 NOTE — Discharge Instructions (Signed)
Take benzonatate 100 mg, 1 tab every 8 hours as needed for cough.  You have been given a shot of dexamethasone   You have been swabbed for COVID, and the test will result in the next 24 hours. Our staff will call you if positive. If the COVID test is positive, you should quarantine for 5 days from the start of your symptoms

## 2022-07-09 NOTE — ED Provider Notes (Signed)
[place]    CSN: [id number] Arrival date & time: 07/09/22  1305      History   Chief Complaint Chief Complaint  Patient presents with   URI    HPI James Blair is a 47 y.o. male.    URI  Here for congestion and cough that began about 2 days ago.  He has had no fever.  No nausea or vomiting or diarrhea.  No malaise or aches.  He mentions that he had a sinus infection about a month ago and those symptoms overall resolved with an antibiotic, though he did have an occasional cough with mucus.  He comes in today expecting an antibiotic to "knock this out".  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  History reviewed. No pertinent past medical history.  There are no problems to display for this patient.   History reviewed. No pertinent surgical history.     Home Medications    Prior to Admission medications   Medication Sig Start Date End Date Taking? Authorizing Provider  benzonatate (TESSALON) 100 MG capsule Take 1 capsule (100 mg total) by mouth 3 (three) times daily as needed for cough. 07/09/22  Yes Barrett Henle, MD  fluticasone (FLONASE) 50 MCG/ACT nasal spray Place 2 sprays into both nostrils daily. Patient not taking: Reported on 07/09/2022 06/16/22   Nyoka Lint, PA-C    Family History Family History  Problem Relation Age of Onset   Heart murmur Mother    Healthy Father     Social History Social History   Tobacco Use   Smoking status: Never   Smokeless tobacco: Never  Vaping Use   Vaping Use: Never used  Substance Use Topics   Alcohol use: Yes    Comment: occasionally   Drug use: Yes    Types: Marijuana     Allergies   Patient has no known allergies.   Review of Systems Review of Systems   Physical Exam Triage Vital Signs ED Triage Vitals  Enc Vitals Group     BP 07/09/22 1420 135/72     Pulse Rate 07/09/22 1420 69     Resp 07/09/22 1420 18     Temp 07/09/22 1420 98.2 F (36.8  C)     Temp Source 07/09/22 1420 Oral     SpO2 07/09/22 1420 97 %     Weight --      Height --      Head Circumference --      Peak Flow --      Pain Score 07/09/22 1417 0     Pain Loc --      Pain Edu? --      Excl. in [place]? --    No data found.  Updated Vital Signs BP 135/72 (BP Location: Right Arm)   Pulse 69   Temp 98.2 F (36.8 C) (Oral)   Resp 18   SpO2 97%   Visual Acuity Right Eye Distance:   Left Eye Distance:   Bilateral Distance:    Right Eye Near:   Left Eye Near:    Bilateral Near:     Physical Exam Vitals reviewed.  Constitutional:      General: He is not in acute distress.    Appearance: He is not ill-appearing, toxic-appearing or diaphoretic.  HENT:     Right Ear: Tympanic membrane and ear canal normal.     Left Ear: Tympanic membrane and ear canal normal.  Nose: Congestion present.     Mouth/Throat:     Mouth: Mucous membranes are moist.     Comments: There is no erythema.  There is clear mucus draining.  There is no asymmetry of the oropharynx. Eyes:     Extraocular Movements: Extraocular movements intact.     Conjunctiva/sclera: Conjunctivae normal.     Pupils: Pupils are equal, round, and reactive to light.  Cardiovascular:     Rate and Rhythm: Normal rate and regular rhythm.     Heart sounds: No murmur heard. Pulmonary:     Effort: Pulmonary effort is normal. No respiratory distress.     Breath sounds: No stridor. No wheezing, rhonchi or rales.  Musculoskeletal:     Cervical back: Neck supple.  Lymphadenopathy:     Cervical: No cervical adenopathy.  Skin:    Capillary Refill: Capillary refill takes less than 2 seconds.     Coloration: Skin is not jaundiced or pale.  Neurological:     General: No focal deficit present.     Mental Status: He is alert and oriented to person, place, and time.  Psychiatric:        Behavior: Behavior normal.      UC Treatments / Results  Labs (all labs ordered are listed, but only abnormal  results are displayed) Labs Reviewed  SARS CORONAVIRUS 2 (TAT 6-24 HRS)    EKG   Radiology No results found.  Procedures Procedures (including critical care time)  Medications Ordered in UC Medications  dexamethasone (DECADRON) injection 20 mg (has no administration in time range)    Initial Impression / Assessment and Plan / UC Course  I have reviewed the triage vital signs and the nursing notes.  Pertinent labs & imaging results that were available during my care of the patient were reviewed by me and considered in my medical decision making (see chart for details).       I have discussed at length with him that at day 2 or 3 of the symptoms, that it is most likely a viral illness.  I discussed that we medical provider should not prescribe him antibiotics when he has had these respiratory symptoms for only 2 or 3 days.   Perles are sent in for his cough.  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  COVID swab is done and if positive he will know if he needs to quarantine.  Final Clinical Impressions(s) / UC Diagnoses   Final diagnoses:  Viral upper respiratory tract infection     Discharge Instructions      Take benzonatate 100 mg, 1 tab every 8 hours as needed for cough.  You have been given a shot of dexamethasone   You have been swabbed for COVID, and the test will result in the next 24 hours. Our staff will call you if positive. If the COVID test is positive, you should quarantine for 5 days from the start of your symptoms      ED Prescriptions     Medication Sig Dispense Auth. Provider   benzonatate (TESSALON) 100 MG capsule Take 1 capsule (100 mg total) by mouth 3 (three) times daily as needed for cough. 21 capsule Barrett Henle, MD      PDMP not reviewed this encounter.   Barrett Henle, MD 07/09/22 1452

## 2022-07-10 LAB — SARS CORONAVIRUS 2 (TAT 6-24 HRS): SARS Coronavirus 2: NEGATIVE

## 2024-02-24 ENCOUNTER — Ambulatory Visit (HOSPITAL_BASED_OUTPATIENT_CLINIC_OR_DEPARTMENT_OTHER): Admit: 2024-02-24 | Admitting: Orthopaedic Surgery

## 2024-02-24 ENCOUNTER — Encounter (HOSPITAL_BASED_OUTPATIENT_CLINIC_OR_DEPARTMENT_OTHER): Payer: Self-pay

## 2024-02-24 SURGERY — ARTHROSCOPY, ELBOW
Anesthesia: Choice | Site: Elbow | Laterality: Right

## 2024-03-30 ENCOUNTER — Encounter: Payer: Self-pay | Admitting: Emergency Medicine

## 2024-03-30 ENCOUNTER — Ambulatory Visit
Admission: EM | Admit: 2024-03-30 | Discharge: 2024-03-30 | Disposition: A | Discharge status: Home / Self Care | Attending: Student | Admitting: Student

## 2024-03-30 DIAGNOSIS — J029 Acute pharyngitis, unspecified: Secondary | ICD-10-CM | POA: Diagnosis present

## 2024-03-30 LAB — POC COVID19/FLU A&B COMBO
Covid Antigen, POC: NEGATIVE
Influenza A Antigen, POC: NEGATIVE
Influenza B Antigen, POC: NEGATIVE

## 2024-03-30 LAB — POCT RAPID STREP A (OFFICE): Rapid Strep A Screen: NEGATIVE

## 2024-03-30 MED ORDER — PREDNISONE 20 MG PO TABS: 20.0000 mg | ORAL_TABLET | Freq: Every day | ORAL | 0 refills | Status: AC

## 2024-03-30 NOTE — ED Triage Notes (Signed)
 Pt c/o left side neck/throat pain for 1 day. Denies any other symptoms.

## 2024-03-30 NOTE — ED Provider Notes (Signed)
 [facility]    CSN: [id number] Arrival date & time: 03/30/24  1258      History   Chief Complaint Chief Complaint  Patient presents with   Sore Throat    HPI James Blair is a 48 y.o. male presenting with sore throat and left cervical adenopathy for 1 day.  He does endorse sick exposure, a coworker had COVID about 10 days ago.  Denies cough, congestion, fever, abdominal pain, nausea/vomiting/diarrhea.  HPI  History reviewed. No pertinent past medical history.  There are no active problems to display for this patient.   History reviewed. No pertinent surgical history.     Home Medications    Prior to Admission medications   Medication Sig Start Date End Date Taking? Authorizing Provider  predniSONE (DELTASONE) 20 MG tablet Take 1 tablet (20 mg total) by mouth daily for 5 days. 03/30/24 04/04/24 Yes Arlyss Leita BRAVO, PA-C    Family History Family History  Problem Relation Age of Onset   Heart murmur Mother    Healthy Father     Social History Social History   Tobacco Use   Smoking status: Never   Smokeless tobacco: Never  Vaping Use   Vaping status: Never Used  Substance Use Topics   Alcohol use: Yes    Comment: occasionally   Drug use: Yes    Types: Marijuana     Allergies   Patient has no known allergies.   Review of Systems Review of Systems  Constitutional:  Negative for appetite change, chills and fever.  HENT:  Positive for sore throat. Negative for congestion, ear pain, rhinorrhea, sinus pressure and sinus pain.   Eyes:  Negative for redness and visual disturbance.  Respiratory:  Negative for cough, chest tightness, shortness of breath and wheezing.   Cardiovascular:  Negative for chest pain and palpitations.  Gastrointestinal:  Negative for abdominal pain, constipation, diarrhea, nausea and vomiting.  Genitourinary:  Negative for dysuria, frequency and urgency.  Musculoskeletal:  Negative for myalgias.  Neurological:  Negative  for dizziness, weakness and headaches.  Psychiatric/Behavioral:  Negative for confusion.   All other systems reviewed and are negative.    Physical Exam Triage Vital Signs ED Triage Vitals  Encounter Vitals Group     BP 03/30/24 1313 124/77     Girls Systolic BP Percentile --      Girls Diastolic BP Percentile --      Boys Systolic BP Percentile --      Boys Diastolic BP Percentile --      Pulse Rate 03/30/24 1313 (!) 55     Resp 03/30/24 1313 16     Temp 03/30/24 1313 98.1 F (36.7 C)     Temp Source 03/30/24 1313 Oral     SpO2 03/30/24 1313 98 %     Weight --      Height --      Head Circumference --      Peak Flow --      Pain Score 03/30/24 1314 6     Pain Loc --      Pain Education --      Exclude from Growth Chart --    No data found.  Updated Vital Signs BP 124/77 (BP Location: Right Arm)   Pulse (!) 55   Temp 98.1 F (36.7 C) (Oral)   Resp 16   SpO2 98%   Visual Acuity Right Eye Distance:   Left Eye Distance:   Bilateral Distance:    Right Eye  Near:   Left Eye Near:    Bilateral Near:     Physical Exam Vitals reviewed.  Constitutional:      General: He is not in acute distress.    Appearance: Normal appearance. He is not ill-appearing.  HENT:     Head: Normocephalic and atraumatic.     Right Ear: Tympanic membrane, ear canal and external ear normal. No tenderness. No middle ear effusion. There is no impacted cerumen. Tympanic membrane is not perforated, erythematous, retracted or bulging.     Left Ear: Tympanic membrane, ear canal and external ear normal. No tenderness.  No middle ear effusion. There is no impacted cerumen. Tympanic membrane is not perforated, erythematous, retracted or bulging.     Nose: Nose normal. No congestion.     Mouth/Throat:     Mouth: Mucous membranes are moist.     Pharynx: Uvula midline. No oropharyngeal exudate or posterior oropharyngeal erythema.  Eyes:     Extraocular Movements: Extraocular movements intact.      Pupils: Pupils are equal, round, and reactive to light.  Cardiovascular:     Rate and Rhythm: Normal rate and regular rhythm.     Heart sounds: Normal heart sounds.  Pulmonary:     Effort: Pulmonary effort is normal.     Breath sounds: Normal breath sounds. No decreased breath sounds, wheezing, rhonchi or rales.  Abdominal:     Palpations: Abdomen is soft.     Tenderness: There is no abdominal tenderness. There is no guarding or rebound.  Lymphadenopathy:     Cervical: Cervical adenopathy present.     Right cervical: Deep cervical adenopathy present. No superficial cervical adenopathy.    Left cervical: Deep cervical adenopathy present. No superficial cervical adenopathy.  Neurological:     General: No focal deficit present.     Mental Status: He is alert and oriented to person, place, and time.  Psychiatric:        Mood and Affect: Mood normal.        Behavior: Behavior normal.        Thought Content: Thought content normal.        Judgment: Judgment normal.      UC Treatments / Results  Labs (all labs ordered are listed, but only abnormal results are displayed) Labs Reviewed  CULTURE, GROUP A STREP [facility])  POCT RAPID STREP A (OFFICE)  POC COVID19/FLU A&B COMBO    EKG   Radiology No results found.  Procedures Procedures (including critical care time)  Medications Ordered in UC Medications - No data to display  Initial Impression / Assessment and Plan / UC Course  I have reviewed the triage vital signs and the nursing notes.  Pertinent labs & imaging results that were available during my care of the patient were reviewed by me and considered in my medical decision making (see chart for details).     Patient with viral pharyngitis. Rapid strep negative, culture sent.  Rapid influenza negative. Rapid covid negative.  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 Also recommended  over-the-counter medications like Afrin, Tylenol /ibuprofen , and good hydration. Return precautions discussed.  Final Clinical Impressions(s) / UC Diagnoses   Final diagnoses:  Pharyngitis, unspecified etiology     Discharge Instructions      -Covid, flu, strep tests were negative. We are sending strep culture to the lab -Prednisone, 2 pills taken at the same time for 5 days in a row.  This will help with the swollen lymph nodes, and should help prevent you from losing your voice. Try taking this earlier in the day as it can give you energy. Avoid NSAIDs like ibuprofen  and alleve while taking this medication as they can increase your risk of stomach upset and even GI bleeding when in combination with a steroid. You can continue tylenol  (acetaminophen ) up to 1000mg  3x daily. -You can take Tylenol  up to 1000 mg 3 times daily, and ibuprofen  up to 600 mg 3 times daily with food.  You can take these together, or alternate every 3-4 hours.      ED Prescriptions     Medication Sig Dispense Auth. Provider   predniSONE (DELTASONE) 20 MG tablet Take 1 tablet (20 mg total) by mouth daily for 5 days. 5 tablet Elverda Wendel E, PA-C      PDMP not reviewed this encounter.   Arlyss Leita BRAVO, PA-C 03/30/24 [phone]

## 2024-03-30 NOTE — Discharge Instructions (Addendum)
-  Covid, flu, strep tests were negative. We are sending strep culture to the lab -Prednisone, 2 pills taken at the same time for 5 days in a row.  This will help with the swollen lymph nodes, and should help prevent you from losing your voice. Try taking this earlier in the day as it can give you energy. Avoid NSAIDs like ibuprofen  and alleve while taking this medication as they can increase your risk of stomach upset and even GI bleeding when in combination with a steroid. You can continue tylenol  (acetaminophen ) up to 1000mg  3x daily. -You can take Tylenol  up to 1000 mg 3 times daily, and ibuprofen  up to 600 mg 3 times daily with food.  You can take these together, or alternate every 3-4 hours.

## 2024-04-03 ENCOUNTER — Ambulatory Visit (HOSPITAL_COMMUNITY): Payer: Self-pay

## 2024-04-03 LAB — CULTURE, GROUP A STREP (THRC)
